# Patient Record
Sex: Female | Born: 1955 | Race: White | Hispanic: No | Marital: Married | Smoking: Former smoker
Health system: Southern US, Community
[De-identification: ages and names within clinical notes are randomized; demographics above are authoritative.]

## PROBLEM LIST (undated history)

## (undated) DIAGNOSIS — G4700 Insomnia, unspecified: Secondary | ICD-10-CM

## (undated) DIAGNOSIS — R51 Headache: Secondary | ICD-10-CM

## (undated) DIAGNOSIS — J302 Other seasonal allergic rhinitis: Secondary | ICD-10-CM

## (undated) DIAGNOSIS — D219 Benign neoplasm of connective and other soft tissue, unspecified: Secondary | ICD-10-CM

## (undated) DIAGNOSIS — R519 Headache, unspecified: Secondary | ICD-10-CM

## (undated) DIAGNOSIS — I10 Essential (primary) hypertension: Secondary | ICD-10-CM

## (undated) DIAGNOSIS — R55 Syncope and collapse: Secondary | ICD-10-CM

## (undated) DIAGNOSIS — M25519 Pain in unspecified shoulder: Secondary | ICD-10-CM

## (undated) DIAGNOSIS — N939 Abnormal uterine and vaginal bleeding, unspecified: Secondary | ICD-10-CM

## (undated) HISTORY — DX: Benign neoplasm of connective and other soft tissue, unspecified: D21.9

## (undated) HISTORY — DX: Pain in unspecified shoulder: M25.519

## (undated) HISTORY — DX: Headache: R51

## (undated) HISTORY — DX: Headache, unspecified: R51.9

## (undated) HISTORY — DX: Syncope and collapse: R55

## (undated) HISTORY — PX: VAGINAL HYSTERECTOMY: SUR661

## (undated) HISTORY — DX: Abnormal uterine and vaginal bleeding, unspecified: N93.9

## (undated) HISTORY — PX: BUNIONECTOMY: SHX129

## (undated) HISTORY — PX: TONSILLECTOMY: SUR1361

## (undated) HISTORY — DX: Other seasonal allergic rhinitis: J30.2

## (undated) HISTORY — DX: Insomnia, unspecified: G47.00

## (undated) HISTORY — PX: ADENOIDECTOMY: SUR15

## (undated) HISTORY — PX: DILATION AND CURETTAGE OF UTERUS: SHX78

## (undated) HISTORY — DX: Essential (primary) hypertension: I10

---

## 2009-06-11 ENCOUNTER — Ambulatory Visit: Payer: Self-pay | Admitting: General Surgery

## 2009-10-14 ENCOUNTER — Other Ambulatory Visit: Payer: Self-pay | Admitting: Unknown Physician Specialty

## 2010-01-09 ENCOUNTER — Emergency Department (HOSPITAL_COMMUNITY)
Admission: EM | Admit: 2010-01-09 | Discharge: 2010-01-09 | Payer: Self-pay | Source: Home / Self Care | Admitting: Emergency Medicine

## 2010-01-09 ENCOUNTER — Emergency Department (HOSPITAL_COMMUNITY)
Admission: EM | Admit: 2010-01-09 | Discharge: 2010-01-09 | Disposition: A | Discharge status: Other Acute Inpatient Hospital | Payer: Self-pay | Source: Home / Self Care | Admitting: Family Medicine

## 2010-02-25 ENCOUNTER — Ambulatory Visit (HOSPITAL_COMMUNITY)
Admission: RE | Admit: 2010-02-25 | Discharge: 2010-02-25 | Payer: Self-pay | Source: Home / Self Care | Attending: Orthopedic Surgery | Admitting: Orthopedic Surgery

## 2011-06-16 ENCOUNTER — Ambulatory Visit: Payer: Self-pay | Admitting: Obstetrics and Gynecology

## 2011-08-02 ENCOUNTER — Ambulatory Visit: Payer: Self-pay | Admitting: Obstetrics and Gynecology

## 2011-08-02 DIAGNOSIS — I10 Essential (primary) hypertension: Secondary | ICD-10-CM

## 2011-08-02 LAB — BASIC METABOLIC PANEL
Calcium, Total: 9 mg/dL (ref 8.5–10.1)
Co2: 28 mmol/L (ref 21–32)
Creatinine: 0.94 mg/dL (ref 0.60–1.30)
EGFR (Non-African Amer.): >60
Osmolality: 280 (ref 275–301)
Potassium: 3.9 mmol/L (ref 3.5–5.1)
Sodium: 139 mmol/L (ref 136–145)

## 2011-08-02 LAB — CBC
HCT: 38.9 % (ref 35.0–47.0)
RBC: 4.11 10*6/uL (ref 3.80–5.20)

## 2011-08-08 ENCOUNTER — Ambulatory Visit: Payer: Self-pay | Admitting: Obstetrics and Gynecology

## 2011-08-11 LAB — PATHOLOGY REPORT

## 2013-06-20 IMAGING — US TRANSABDOMINAL ULTRASOUND OF PELVIS
1 series · 17 of 25 positions shown · non-contrast
Comparison: none

REASON FOR EXAM: Abn Uterine Bleeding Pelvic Pain
COMMENTS:

[Series 1: transabdominal ultrasound of pelvis · 17 of 47 slices shown]
[im 1/47]
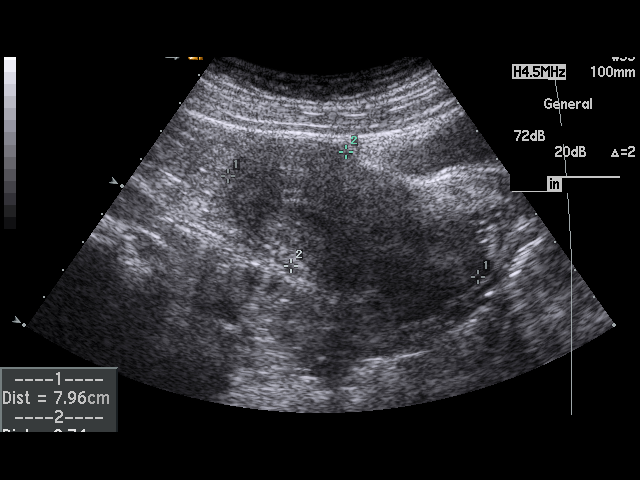
[im 4/47]
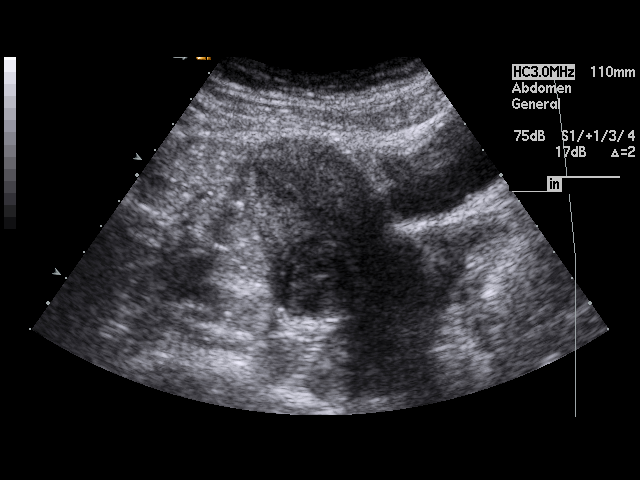
[im 6/47]
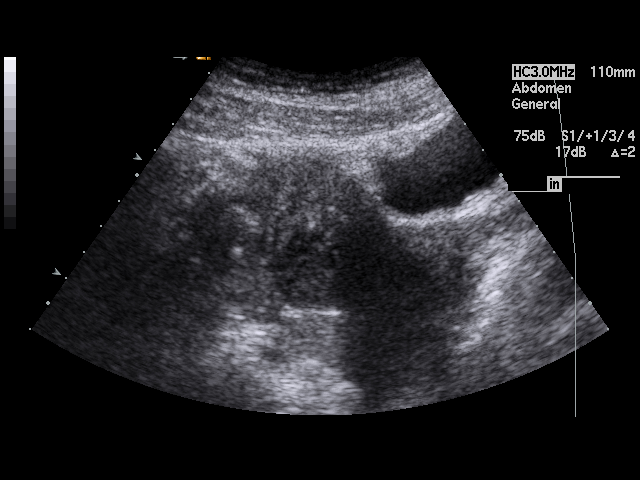
[im 10/47]
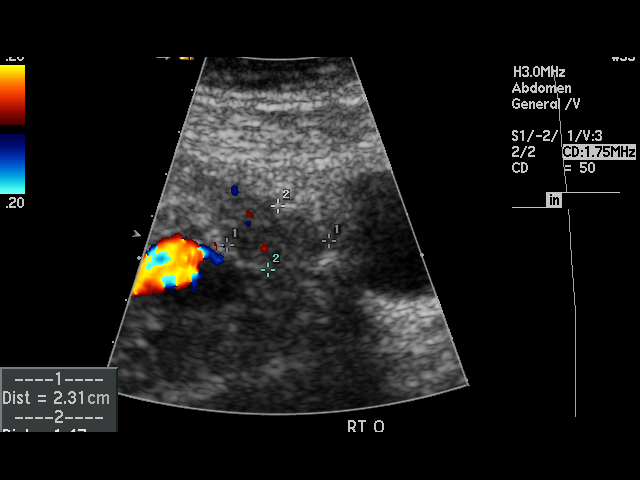
[im 12/47]
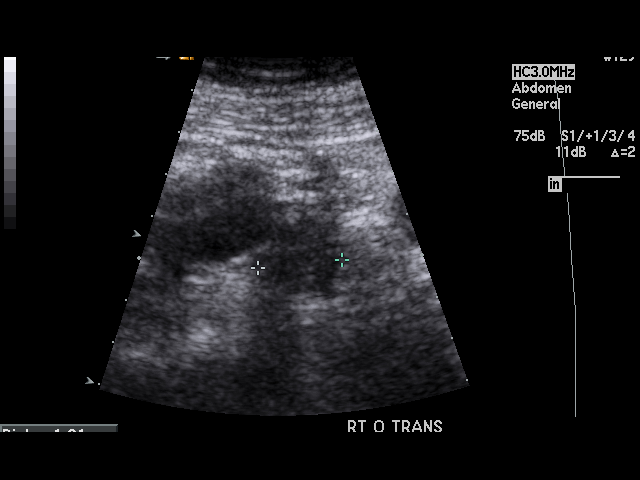
[im 16/47]
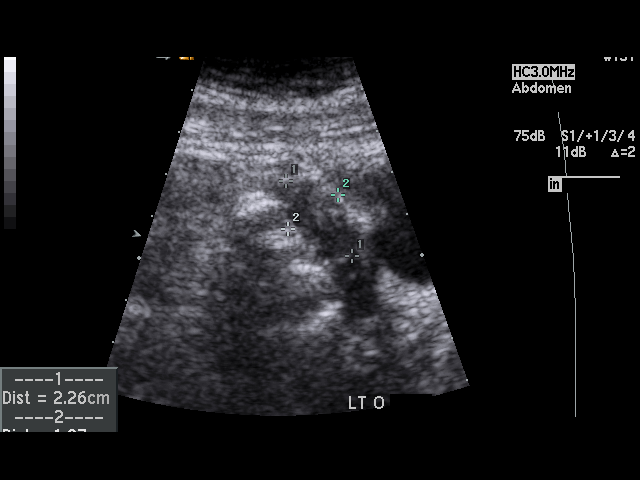
[im 18/47]
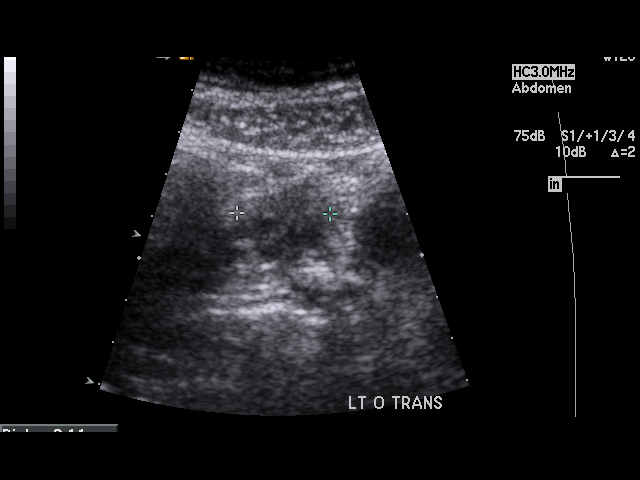
[im 22/47]
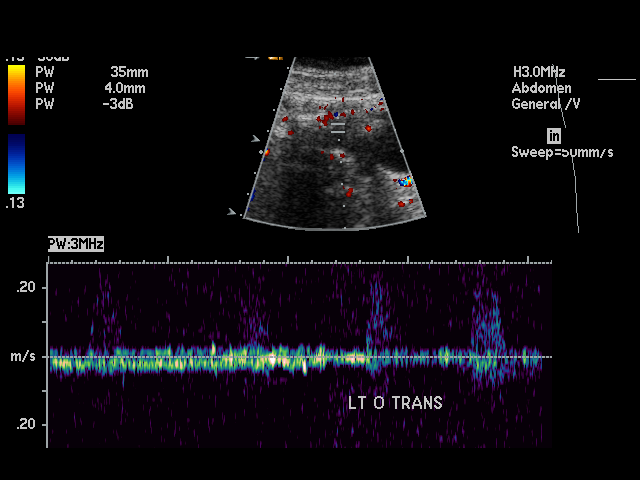
[im 24/47]
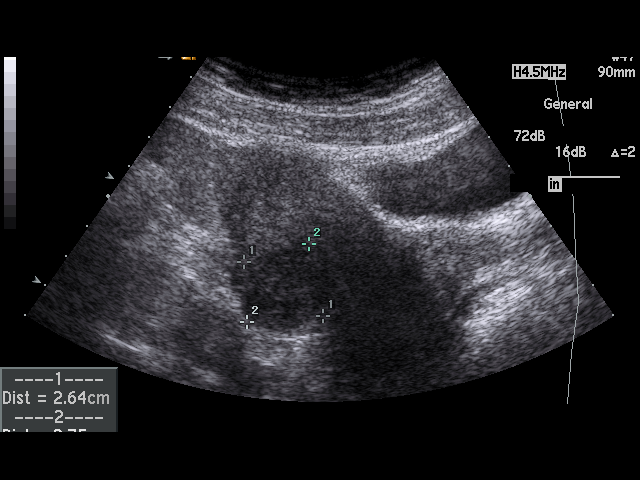
[im 25/47]
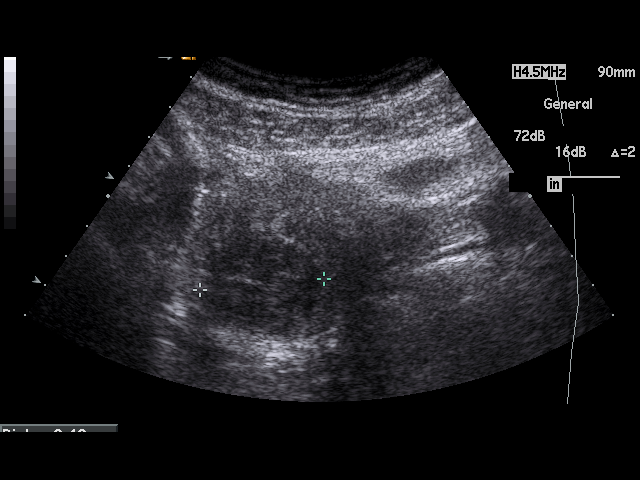
[im 29/47]
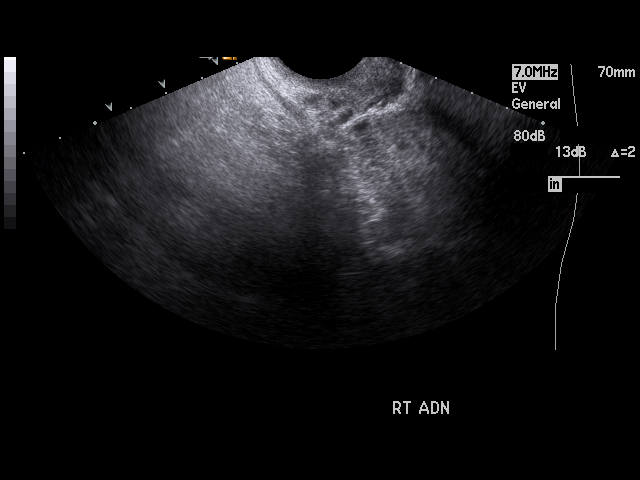
[im 31/47]
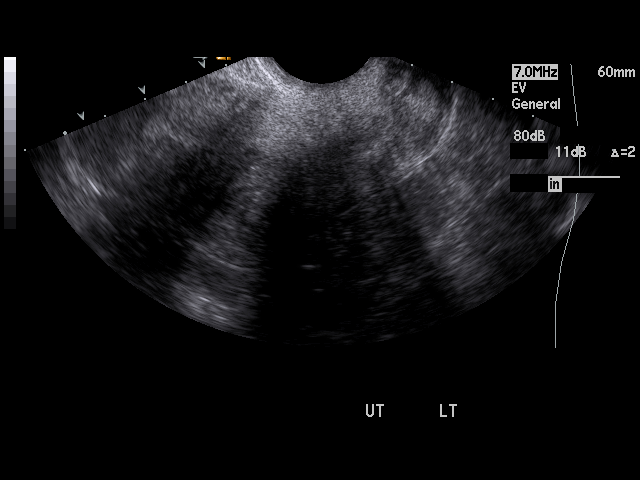
[im 35/47]
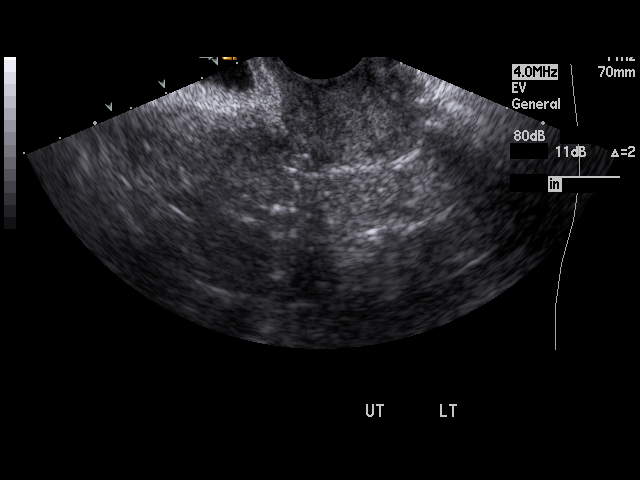
[im 37/47]
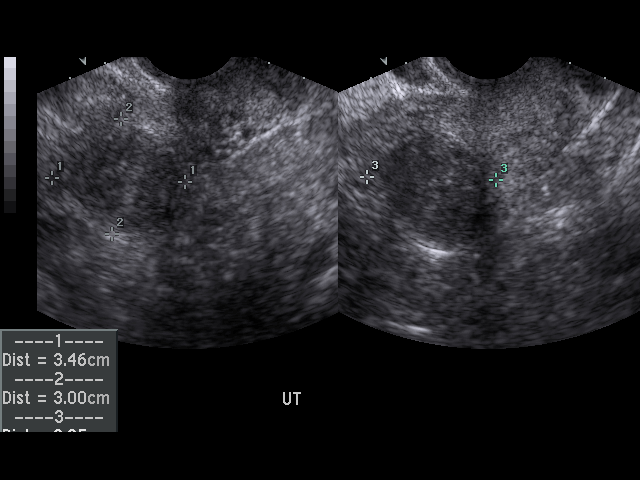
[im 41/47]
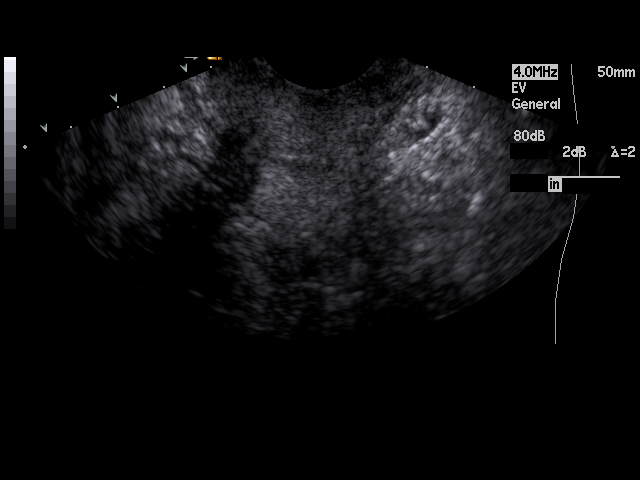
[im 43/47]
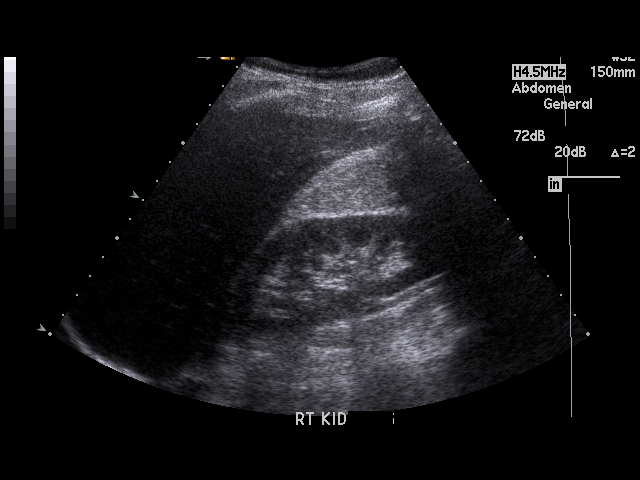
[im 47/47]
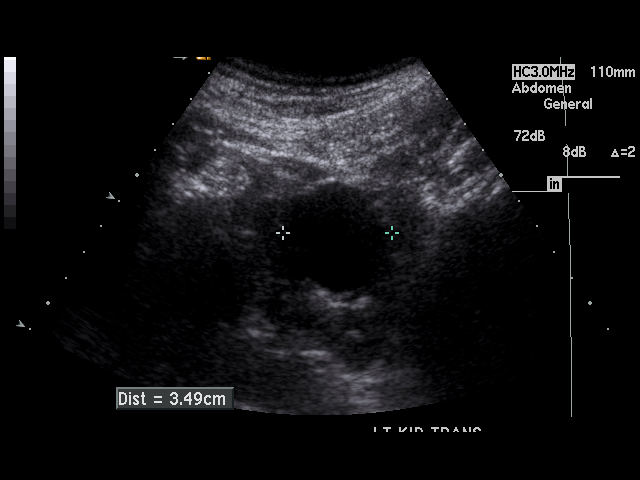

[17 of 25 positions shown; findings below may reference images not displayed]

PROCEDURE:     GE - GE PELVIS NON-OB W/TRANSVAGINAL  - June 16, 2011 [DATE]

RESULT:     Transabdominal and endovaginal ultrasound was performed. The
uterus measures 7.96 cm x 3.74 cm x 4.2 cm. The endometrium measures 3.7 mm
in thickness. There is a 3.46 cm hypoechoic mass involving the posterior
aspect of the uterine corpus and consistent with a uterine fibroid. Also
noted is a 1.1 cm hypoechoic mass near the uterine fundus on the left and
consistent with a small uterine fibroid. No other uterine masses are seen.
The right and left ovaries are visualized. The right ovary measures 2.3 cm
at maximum diameter and the left ovary measures 2.2 cm at maximum diameter.
No abnormal adnexal masses are identified. The right and left kidneys are
visualized. No hydronephrosis is seen. There is a 4.07 cm cyst at the upper
pole of the left kidney.
IMPRESSION: 1. There are observed two uterine masses consistent with uterine fibroids.
2. No abnormal adnexal masses are identified.
3. No free fluid is seen in the pelvis.
4. Incidental note is made of a left renal cyst.

## 2013-10-03 LAB — HM PAP SMEAR

## 2013-10-18 DIAGNOSIS — J309 Allergic rhinitis, unspecified: Secondary | ICD-10-CM | POA: Insufficient documentation

## 2013-10-18 DIAGNOSIS — N951 Menopausal and female climacteric states: Secondary | ICD-10-CM | POA: Insufficient documentation

## 2013-10-18 DIAGNOSIS — I10 Essential (primary) hypertension: Secondary | ICD-10-CM | POA: Insufficient documentation

## 2013-10-18 DIAGNOSIS — G4700 Insomnia, unspecified: Secondary | ICD-10-CM | POA: Insufficient documentation

## 2014-06-08 NOTE — Op Note (Signed)
PATIENT NAME:  Jill Holland, Jill Holland MR#:  [id number] DATE OF BIRTH:  1955/06/16  DATE OF PROCEDURE:  08/08/2011  PREOPERATIVE DIAGNOSIS:  1. Abnormal uterine bleeding.  2. Chronic pelvic pain.  3. Leiomyoma uteri.   POSTOPERATIVE DIAGNOSES:  1. Abnormal uterine bleeding.  2. Chronic pelvic pain.  3. Leiomyoma uteri.   OPERATIVE PROCEDURES: Transvaginal hysterectomy with bilateral salpingo-oophorectomy.   SURGEON: Jill Holland. Jill Fiorentino, MD  FIRST ASSISTANT: None.   ANESTHESIA: General endotracheal.   INDICATIONS: Jill Holland is a 59 year old white female, para 2-0-0-2, who presents for surgical management of chronic pelvic pain and abnormal uterine bleeding which has been refractory to conservative medical therapies. She does have known leiomyoma on ultrasound. The patient desires definitive surgery, including removal of both ovaries.   FINDINGS AT SURGERY: Findings at surgery revealed a multifibroid uterus. The ovaries and tubes were grossly normal bilaterally.   DESCRIPTION OF PROCEDURE: The patient was brought to the Operating Room where she was placed in the supine position. General endotracheal anesthesia was induced without difficulty. She was placed in the dorsal lithotomy position using the candy cane stirrups. A Betadine abdominal, perineal, intravaginal prep and drape was performed in the standard fashion. A Foley catheter was placed and was draining clear yellow urine from the bladder. A weighted speculum was placed into the vagina, and a double-tooth tenaculum was placed on the anterior lip of the cervix. Posterior colpotomy was made with Mayo scissors. The uterosacral ligaments were clamped, cut, and stick tied using 0 Vicryl suture. Two other additional bites were taken along the uterosacral ligament complexes and were stick tied with 0 Vicryl. The cervix was circumscribed with a scalpel. The bladder was dissected off the lower uterine segment through sharp and blunt dissection.  The anterior cul-de-sac was eventually entered. Sequentially, the cardinal-broad ligament complexes were clamped, cut, and stick tied up to the level of the utero-ovarian ligaments. At this point, the ligaments were crossclamped and the uterus was removed from the operative field. The ovaries were identified bilaterally using the aid of a Babcock clamp. The infundibulopelvic ligaments were then crossclamped with a curved Heaney clamp. The tube and ovary were excised with Mayo scissors. This was Holland bilaterally. Next, the pedicles were made hemostatic with 0 Vicryl suture. The first tie was a free tie. The following tie was a stick tie. These pedicles were tagged. Next, the posterior cuff was run using a 0 chromic suture in simple baseball stitch running manner. A good hemostasis was obtained here. Finally, a pursestring suture of 0 Vicryl was used to close the peritoneum. It was free tied using the Vicryl suture. Finally, the vaginal mucosa was closed using 2-0 chromic sutures in a simple interrupted manner. Upon completion of the procedure, all instruments were removed from the vagina. The patient was then awakened, extubated, and taken to the recovery room in satisfactory condition. Estimated blood loss was 50 mL. IV fluids infused were 1800 mL. Urine output was 300 mL of clear urine. All instruments, needle and sponge counts were verified as correct. The patient did receive Ancef antibiotic prophylaxis.   ____________________________ Jill Holland Jill Brien, MD mad:cbb D: 08/08/2011 23:14:31 ET T: 08/09/2011 10:20:25 ET JOB#: [id number]  cc: Jill Holland A. Jill Sanger, MD, <Dictator> Jill Holland Jill Bold MD ELECTRONICALLY SIGNED 08/16/2011 12:14

## 2014-06-08 NOTE — H&P (Signed)
PATIENT NAME:  Jill Holland, SKODA MR#:  [id number] DATE OF BIRTH:  Apr 13, 1955  DATE OF ADMISSION:  08/08/2011  PREOPERATIVE DIAGNOSES:  1. Chronic pelvic pain.  2. Abnormal uterine bleeding refractory to conservative medical therapy.  3. Uterine fibroids.   HISTORY: Jill Holland is a 59 year old white female, para 1-0-1-1, on continuous HRT therapy with Prempro 0.45/1.5 mg p.o. daily who presents for definitive surgery of multiple medical issues. The patient has been having chronic pelvic pain and abnormal uterine bleeding that has been refractory to hormonal therapy; endometrial biopsy 06/15/2011 revealed benign endometrial fragments without hyperplasia or carcinoma. Ultrasound 06/16/2011 revealed two uterine fibroids measuring 3.46 and 1.1 cm in greatest diameter along with normal ovaries. The patient desires definitive surgery at this time.   PAST MEDICAL HISTORY:  1. Insomnia.  2. Seasonal allergies.  3. Headaches.  4. Chronic hypertension.   PAST SURGICAL HISTORY:  1. Foot surgery x4 including bunion surgery on each foot as well as reconstruction.  2. Dilation and curettage.  3. Tonsillectomy and adenoidectomy.  PAST OB HISTORY: Para 1-0-1-1. G1 SVD 7 pound 4 ounce female. G2 SAB requiring dilation and curettage.  PAST GYN HISTORY: Menarche age 29. Menopause age 47. No history of abnormal Pap smears. No history of pelvic infections or STIs.   FAMILY HISTORY: Negative for cancer of the colon or ovary. Paternal aunt had breast cancer. Heart disease and diabetes mellitus are noted in her family.   SOCIAL HISTORY: The patient does not smoke. The patient does not drink. The patient does not use drugs. 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  REVIEW OF SYSTEMS: The patient denies recent illness. She denies coagulopathy. She denies reactive airway disease.   CURRENT MEDICATIONS:  1. Nasonex spray two sprays both nostrils once a day. 2. Singulair 1 tablet daily.   3. Prempro 0.45/1.5 mg one per day. 4. Ambien 10 mg at bedtime p.r.n.  5. Lisinopril/hydrochlorothiazide 20/25 mg 1 daily.  6. Xyzal 5 mg 1 daily.   DRUG ALLERGIES: None.   PHYSICAL EXAMINATION:   VITAL SIGNS: Height 5 feet 6 inches, weight 137, blood pressure 120/79, BMI 22, pulse 77.   GENERAL: The patient is a pleasant well appearing white female with normal affect. She is alert and oriented.   OROPHARYNX: Clear.   NECK: Supple. No thyromegaly or adenopathy.   LUNGS: Clear.   HEART: Regular rate and rhythm without murmur.   ABDOMEN: Soft, nontender. No organomegaly.   PELVIC: External genitalia normal. BUS normal. Vagina has good estrogen effect. Cervix is parous. Uterus is midplane, mobile and of normal size and shape. Adnexa are clear.   RECTAL: External rectal exam is normal. Bony pelvis is gynecoid.   EXTREMITIES: Without clubbing, cyanosis, or edema.   SKIN: Without rash.   MUSCULOSKELETAL: Normal.   IMPRESSION:  1. Chronic pelvic pain.  2. Abnormal uterine bleeding refractory to hormonal therapy.  3. Uterine fibroids.   PLAN: TVH, BSO. Date of surgery is 08/08/2011.   CONSENT NOTE: Jill Holland is to undergo TVH, BSO for symptomatic fibroid. She is adamant about having ovaries removed regardless of approach. The patient is understanding of the planned procedure to be TVH, BSO with possible need for laparoscopy. She is accepting of all risks which include, but are not limited to, bleeding, infection, pelvic organ injury with need for repair, blood clot disorders, neurologic injury, anesthesia risks, and death. All questions are answered. Informed consent is given. The patient is ready and willing to proceed  with surgery as scheduled.  ____________________________ Alanda Slim Jill Ambrocio, MD mad:drc D: 08/03/2011 08:50:41 ET T: 08/03/2011 09:46:40 ET JOB#: [id number]  cc: Hassell Done A. Edynn Gillock, MD, <Dictator> Alanda Slim Seydina Holliman MD ELECTRONICALLY SIGNED  08/16/2011 12:14

## 2014-10-06 ENCOUNTER — Other Ambulatory Visit: Payer: Self-pay

## 2014-10-06 MED ORDER — ESTRADIOL 1 MG PO TABS: 1.5000 mg | ORAL_TABLET | Freq: Every day | ORAL | Status: DC

## 2014-10-09 ENCOUNTER — Encounter: Payer: Self-pay | Admitting: Obstetrics and Gynecology

## 2014-11-07 ENCOUNTER — Telehealth: Payer: Self-pay | Admitting: Obstetrics and Gynecology

## 2014-11-07 MED ORDER — ESTRADIOL 1 MG PO TABS: 1.5000 mg | ORAL_TABLET | Freq: Every day | ORAL | Status: DC

## 2014-11-07 NOTE — Telephone Encounter (Signed)
Needs estradiol 1 mg refilled.

## 2014-11-07 NOTE — Telephone Encounter (Signed)
Pt aware med erx. 

## 2014-11-27 ENCOUNTER — Ambulatory Visit (INDEPENDENT_AMBULATORY_CARE_PROVIDER_SITE_OTHER): Payer: 59 | Admitting: Obstetrics and Gynecology

## 2014-11-27 ENCOUNTER — Encounter: Payer: Self-pay | Admitting: Obstetrics and Gynecology

## 2014-11-27 VITALS — BP 115/70 | HR 94 | Ht 66.0 in | Wt 150.3 lb

## 2014-11-27 DIAGNOSIS — Z90710 Acquired absence of both cervix and uterus: Secondary | ICD-10-CM | POA: Diagnosis not present

## 2014-11-27 DIAGNOSIS — Z90722 Acquired absence of ovaries, bilateral: Secondary | ICD-10-CM

## 2014-11-27 DIAGNOSIS — Z01419 Encounter for gynecological examination (general) (routine) without abnormal findings: Secondary | ICD-10-CM

## 2014-11-27 DIAGNOSIS — R5383 Other fatigue: Secondary | ICD-10-CM | POA: Diagnosis not present

## 2014-11-27 DIAGNOSIS — Z1211 Encounter for screening for malignant neoplasm of colon: Secondary | ICD-10-CM

## 2014-11-27 DIAGNOSIS — N393 Stress incontinence (female) (male): Secondary | ICD-10-CM

## 2014-11-27 DIAGNOSIS — Z9079 Acquired absence of other genital organ(s): Secondary | ICD-10-CM | POA: Diagnosis not present

## 2014-11-27 MED ORDER — ESTRADIOL 1 MG PO TABS: 1.5000 mg | ORAL_TABLET | Freq: Every day | ORAL | Status: DC

## 2014-11-27 NOTE — Patient Instructions (Signed)
1.  No further Pap smears are necessary. 2.  Mammogram ordered. 3.  EpiprocolonTests ordered (Patient declines colonoscopy). 4.  Routine lab work ordered. 5.  Estradiol 1.5 mg daily, refills 6.  Return in one year for annual physical.

## 2014-11-27 NOTE — Progress Notes (Signed)
Patient ID: Humberto Seals, female   DOB: 26-May-1955, 59 y.o.   MRN: [id number] ANNUAL PREVENTATIVE CARE GYN  ENCOUNTER NOTE  Subjective:       ISLAND DOHMEN is a 59 y.o. No obstetric history on file. female here for a routine annual gynecologic exam.  Current complaints: 1.  Leaking some- tiny amount wearing panty liner     59 year old white female, para 1011, status post TVH, BSO for uterine fibroids, on estradiol 1.5 mg daily, presents for annual physical.  She is not experiencing any significant vasomotor symptoms.  Bowel function is normal.  She declines colonoscopy screening and is willing to proceed with a blood test.  She is having some mild stress incontinence for which she wears a panty liner.  Options of management were reviewed regarding SUI management. Patient is taking calcium with vitamin D.  She is eating healthy and exercising. Patient does complain of some chronic fatigue.  Gynecologic History No LMP recorded. Patient has had a hysterectomy. Contraception: status post hysterectomyTVH BSO Last Pap: no further paps needed. Results were: normal Last mammogram: did not due last year. Results were: normal  Obstetric History Para 1011  Past Medical History  Diagnosis Date  . Fibroids   . Abnormal uterine bleeding (AUB)   . Hypertension   . Headache   . Seasonal allergies   . Insomnia   . Shoulder pain   . Vasomotor instability     Past Surgical History  Procedure Laterality Date  . Vaginal hysterectomy      and bso-fibroid UT ovariaes w/stromal hyperplasia  . Dilation and curettage of uterus    . Bunionectomy    . Tonsillectomy    . Adenoidectomy      Current Outpatient Prescriptions on File Prior to Visit  Medication Sig Dispense Refill  . estradiol (ESTRACE) 1 MG tablet Take 1.5 tablets (1.5 mg total) by mouth daily. 45 tablet 0  . lisinopril-hydrochlorothiazide (PRINZIDE,ZESTORETIC) 20-25 MG per tablet Take 1 tablet by mouth daily.    Marland Kitchen zolpidem  (AMBIEN) 10 MG tablet Take 10 mg by mouth at bedtime as needed for sleep.     No current facility-administered medications on file prior to visit.    No Known Allergies  Social History   Social History  . Marital Status: Married    Spouse Name: N/A  . Number of Children: N/A  . Years of Education: N/A   Occupational History  . Not on file.   Social History Main Topics  . Smoking status: Former Research scientist (life sciences)  . Smokeless tobacco: Not on file  . Alcohol Use: 4.2 oz/week    7 Glasses of wine per week     Comment: qd  . Drug Use: No  . Sexual Activity: Yes    Birth Control/ Protection: Surgical   Other Topics Concern  . Not on file   Social History Narrative    Family History  Problem Relation Age of Onset  . Breast cancer Paternal Aunt   . Ovarian cancer Neg Hx   . Colon cancer Neg Hx   . Heart disease Father   . Diabetes Maternal Grandmother   . Diabetes Paternal Grandmother     The following portions of the patient's history were reviewed and updated as appropriate: allergies, current medications, past family history, past medical history, past social history, past surgical history and problem list.  Review of Systems ROS Review of Systems - General ROS: negative for - chills, fatigue, fever, hot flashes, night sweats,  weight gain or weight loss Psychological ROS: negative for - anxiety, decreased libido, depression, mood swings, physical abuse or sexual abuse Ophthalmic ROS: negative for - blurry vision, eye pain or loss of vision ENT ROS: negative for - headaches, hearing change, visual changes or vocal changes Allergy and Immunology ROS: negative for - hives, itchy/watery eyes or seasonal allergies Hematological and Lymphatic ROS: negative for - bleeding problems, bruising, swollen lymph nodes or weight loss Endocrine ROS: negative for - galactorrhea, hair pattern changes, hot flashes, malaise/lethargy, mood swings, palpitations, polydipsia/polyuria, skin changes,  temperature intolerance or unexpected weight changes Breast ROS: negative for - new or changing breast lumps or nipple discharge Respiratory ROS: negative for - cough or shortness of breath Cardiovascular ROS: negative for - chest pain, irregular heartbeat, palpitations or shortness of breath Gastrointestinal ROS: no abdominal pain, change in bowel habits, or black or bloody stools Genito-Urinary ROS: no dysuria, trouble voiding, or hematuria.  POSITIVE-mild leaking urine Musculoskeletal ROS: negative for - joint pain or joint stiffness Neurological ROS: negative for - bowel and bladder control changes Dermatological ROS: negative for rash and skin lesion changes   Objective:   BP 115/70 mmHg  Pulse 94  Ht 5\' 6"  (1.676 m)  Wt 150 lb 4.8 oz (68.176 kg)  BMI 24.27 kg/m2 CONSTITUTIONAL: Well-developed, well-nourished female in no acute distress.  PSYCHIATRIC: Normal mood and affect. Normal behavior. Normal judgment and thought content. [place]: Alert and oriented to person, place, and time. Normal muscle tone coordination. No cranial nerve deficit noted. HENT:  Normocephalic, atraumatic, External right and left ear normal. Oropharynx is clear and moist EYES: Conjunctivae and EOM are normal. Pupils are equal, round, and reactive to light. No scleral icterus.  NECK: Normal range of motion, supple, no masses.  Normal thyroid.  SKIN: Skin is warm and dry. No rash noted. Not diaphoretic. No erythema. No pallor. CARDIOVASCULAR: Normal heart rate noted, regular rhythm, no murmur. RESPIRATORY: Clear to auscultation bilaterally. Effort and breath sounds normal, no problems with respiration noted. BREASTS: Symmetric in size. No masses, skin changes, nipple drainage, or lymphadenopathy. ABDOMEN: Soft, normal bowel sounds, no distention noted.  No tenderness, rebound or guarding.  BLADDER: Normal PELVIC:  External Genitalia: Normal  BUS: Normal  Vagina: Normal; good vault support.  Cervix:  surgically absent  Uterus: surgically absent  Adnexa: Normal  RV: External Exam NormaI, No Rectal Masses and Normal Sphincter tone  MUSCULOSKELETAL: Normal range of motion. No tenderness.  No cyanosis, clubbing, or edema.  2+ distal pulses. LYMPHATIC: No Axillary, Supraclavicular, or Inguinal Adenopathy.    Assessment:   Annual gynecologic examination 59 y.o. Contraception: status post hysterectomy Normal BMI Problem List Items Addressed This Visit    None      Plan:  Pap: Not needed Mammogram: Ordered Stool Guaiac Testing:  epicolon orderd Labs: tsh vit d fbs a1c lipid Routine preventative health maintenance measures emphasized: Exercise/Diet/Weight control, Tobacco Warnings and Alcohol/Substance use risks Continue calcium with vitamin D. Refill estradiol 1.5 mg daily Return to [place], [place]  Brayton Mars, MD

## 2014-11-28 ENCOUNTER — Other Ambulatory Visit: Payer: 59

## 2014-11-29 LAB — EPI PROCOLON(R), SEPTIN 9

## 2014-11-29 LAB — CBC WITH DIFFERENTIAL/PLATELET
BASOS ABS: 0 10*3/uL (ref 0.0–0.2)
Basos: 0 %
EOS (ABSOLUTE): 0.1 10*3/uL (ref 0.0–0.4)
EOS: 2 %
HEMOGLOBIN: 14 g/dL (ref 11.1–15.9)
Hematocrit: 41.1 % (ref 34.0–46.6)
IMMATURE GRANULOCYTES: 0 %
Immature Grans (Abs): 0 10*3/uL (ref 0.0–0.1)
LYMPHS ABS: 2.3 10*3/uL (ref 0.7–3.1)
LYMPHS: 40 %
MCH: 32 pg (ref 26.6–33.0)
MCHC: 34.1 g/dL (ref 31.5–35.7)
MCV: 94 fL (ref 79–97)
MONOCYTES: 8 %
Monocytes Absolute: 0.5 10*3/uL (ref 0.1–0.9)
NEUTROS PCT: 50 %
Neutrophils Absolute: 2.9 10*3/uL (ref 1.4–7.0)
Platelets: 267 10*3/uL (ref 150–379)
RBC: 4.37 x10E6/uL (ref 3.77–5.28)
RDW: 12.9 % (ref 12.3–15.4)
WBC: 5.8 10*3/uL (ref 3.4–10.8)

## 2014-11-29 LAB — TSH: TSH: 1.03 u[IU]/mL (ref 0.450–4.500)

## 2014-11-29 LAB — HEMOGLOBIN A1C
Est. average glucose Bld gHb Est-mCnc: 111 mg/dL
Hgb A1c MFr Bld: 5.5 % (ref 4.8–5.6)

## 2014-11-29 LAB — LIPID PANEL
CHOL/HDL RATIO: 2.2 ratio (ref 0.0–4.4)
Cholesterol, Total: 260 mg/dL — ABNORMAL HIGH (ref 100–199)
HDL: 119 mg/dL (ref >39)
LDL CALC: 121 mg/dL — AB (ref 0–99)
TRIGLYCERIDES: 101 mg/dL (ref 0–149)
VLDL CHOLESTEROL CAL: 20 mg/dL (ref 5–40)

## 2014-11-29 LAB — GLUCOSE, RANDOM: Glucose: 95 mg/dL (ref 65–99)

## 2014-11-29 LAB — VITAMIN D 25 HYDROXY (VIT D DEFICIENCY, FRACTURES): VIT D 25 HYDROXY: 42.4 ng/mL (ref 30.0–100.0)

## 2014-12-04 ENCOUNTER — Telehealth: Payer: Self-pay | Admitting: Obstetrics and Gynecology

## 2014-12-04 DIAGNOSIS — Z1211 Encounter for screening for malignant neoplasm of colon: Secondary | ICD-10-CM

## 2014-12-04 NOTE — Telephone Encounter (Signed)
PT CALLED AND SHE HAD SOME LAB WORK DONE LAST WEEK AND SHE WANTED TO KNOW THE RESULTS OF THEM.

## 2014-12-05 ENCOUNTER — Encounter (INDEPENDENT_AMBULATORY_CARE_PROVIDER_SITE_OTHER): Payer: Self-pay

## 2014-12-17 NOTE — Telephone Encounter (Signed)
Pt aware of lab results- will repeat epi-procolon. Lab ordered.

## 2014-12-18 ENCOUNTER — Other Ambulatory Visit: Payer: Self-pay | Admitting: Obstetrics and Gynecology

## 2014-12-18 ENCOUNTER — Other Ambulatory Visit: Payer: 59

## 2015-01-01 LAB — EPI PROCOLON(R), SEPTIN 9

## 2015-11-30 NOTE — Progress Notes (Unsigned)
ANNUAL PREVENTATIVE CARE GYN  ENCOUNTER NOTE  Subjective:       Jill Holland is a 60 y.o. No obstetric history on file. female here for a routine annual gynecologic exam.  Current complaints: 1.      Gynecologic History No LMP recorded. Patient has had a hysterectomy. Contraception: status post hysterectomy Last Pap: not needed Results were: normal Last mammogram: ?. Results were: ?  Obstetric History OB History  No data available    Past Medical History:  Diagnosis Date  . Abnormal uterine bleeding (AUB)   . Fibroids   . Headache   . Hypertension   . Insomnia   . Seasonal allergies   . Shoulder pain   . Vasomotor instability     Past Surgical History:  Procedure Laterality Date  . ADENOIDECTOMY    . BUNIONECTOMY    . DILATION AND CURETTAGE OF UTERUS    . TONSILLECTOMY    . VAGINAL HYSTERECTOMY     and bso-fibroid UT ovariaes w/stromal hyperplasia    Current Outpatient Prescriptions on File Prior to Visit  Medication Sig Dispense Refill  . estradiol (ESTRACE) 1 MG tablet Take 1.5 tablets (1.5 mg total) by mouth daily. 45 tablet 11  . fexofenadine (ALLEGRA) 180 MG tablet Take by mouth.    Marland Kitchen lisinopril-hydrochlorothiazide (PRINZIDE,ZESTORETIC) 20-25 MG per tablet Take 1 tablet by mouth daily.    Marland Kitchen triamcinolone (NASACORT) 55 MCG/ACT AERO nasal inhaler Place into the nose.    . zolpidem (AMBIEN) 10 MG tablet Take 10 mg by mouth at bedtime as needed for sleep.     No current facility-administered medications on file prior to visit.     No Known Allergies  Social History   Social History  . Marital status: Married    Spouse name: N/A  . Number of children: N/A  . Years of education: N/A   Occupational History  . Not on file.   Social History Main Topics  . Smoking status: Former Research scientist (life sciences)  . Smokeless tobacco: Not on file  . Alcohol use 4.2 oz/week    7 Glasses of wine per week     Comment: qd  . Drug use: No  . Sexual activity: Yes    Birth  control/ protection: Surgical   Other Topics Concern  . Not on file   Social History Narrative  . No narrative on file    Family History  Problem Relation Age of Onset  . Breast cancer Paternal Aunt   . Ovarian cancer Neg Hx   . Colon cancer Neg Hx   . Heart disease Father   . Diabetes Maternal Grandmother   . Diabetes Paternal Grandmother     The following portions of the patient's history were reviewed and updated as appropriate: allergies, current medications, past family history, past medical history, past social history, past surgical history and problem list.  Review of Systems ROS Review of Systems - General ROS: negative for - chills, fatigue, fever, hot flashes, night sweats, weight gain or weight loss Psychological ROS: negative for - anxiety, decreased libido, depression, mood swings, physical abuse or sexual abuse Ophthalmic ROS: negative for - blurry vision, eye pain or loss of vision ENT ROS: negative for - headaches, hearing change, visual changes or vocal changes Allergy and Immunology ROS: negative for - hives, itchy/watery eyes or seasonal allergies Hematological and Lymphatic ROS: negative for - bleeding problems, bruising, swollen lymph nodes or weight loss Endocrine ROS: negative for - galactorrhea, hair pattern changes, hot  flashes, malaise/lethargy, mood swings, palpitations, polydipsia/polyuria, skin changes, temperature intolerance or unexpected weight changes Breast ROS: negative for - new or changing breast lumps or nipple discharge Respiratory ROS: negative for - cough or shortness of breath Cardiovascular ROS: negative for - chest pain, irregular heartbeat, palpitations or shortness of breath Gastrointestinal ROS: no abdominal pain, change in bowel habits, or black or bloody stools Genito-Urinary ROS: no dysuria, trouble voiding, or hematuria Musculoskeletal ROS: negative for - joint pain or joint stiffness Neurological ROS: negative for - bowel and  bladder control changes Dermatological ROS: negative for rash and skin lesion changes   Objective:   There were no vitals taken for this visit. CONSTITUTIONAL: Well-developed, well-nourished female in no acute distress.  PSYCHIATRIC: Normal mood and affect. Normal behavior. Normal judgment and thought content. [place]: Alert and oriented to person, place, and time. Normal muscle tone coordination. No cranial nerve deficit noted. HENT:  Normocephalic, atraumatic, External right and left ear normal. Oropharynx is clear and moist EYES: Conjunctivae and EOM are normal. Pupils are equal, round, and reactive to light. No scleral icterus.  NECK: Normal range of motion, supple, no masses.  Normal thyroid.  SKIN: Skin is warm and dry. No rash noted. Not diaphoretic. No erythema. No pallor. CARDIOVASCULAR: Normal heart rate noted, regular rhythm, no murmur. RESPIRATORY: Clear to auscultation bilaterally. Effort and breath sounds normal, no problems with respiration noted. BREASTS: Symmetric in size. No masses, skin changes, nipple drainage, or lymphadenopathy. ABDOMEN: Soft, normal bowel sounds, no distention noted.  No tenderness, rebound or guarding.  BLADDER: Normal PELVIC:  External Genitalia: Normal  BUS: Normal  Vagina: Normal  Cervix: Normal  Uterus: Normal  Adnexa: Normal  RV: {Blank multiple:19196::"External Exam NormaI","No Rectal Masses","Normal Sphincter tone"}  MUSCULOSKELETAL: Normal range of motion. No tenderness.  No cyanosis, clubbing, or edema.  2+ distal pulses. LYMPHATIC: No Axillary, Supraclavicular, or Inguinal Adenopathy.    Assessment:   Annual gynecologic examination 60 y.o. Contraception: status post hysterectomy Normal BMI Problem List Items Addressed This Visit    Status post TVH, BSO   SUI (stress urinary incontinence, female)    Other Visit Diagnoses    Well woman exam with routine gynecological exam    -  Primary   Screening for colon cancer           Plan:  Pap: Not needed Mammogram: Ordered Stool Guaiac Testing:  Ordered Labs: lipid, vit d, a1c, tsh lipid Routine preventative health maintenance measures emphasized: {Blank multiple:19196::"Exercise/Diet/Weight control","Tobacco Warnings","Alcohol/Substance use risks","Stress Management","Peer Pressure Issues","Safe Sex"} *** Return to [place], [place]

## 2015-12-01 ENCOUNTER — Encounter: Payer: 59 | Admitting: Obstetrics and Gynecology

## 2015-12-28 NOTE — Progress Notes (Deleted)
Patient ID: Jill Holland, female   DOB: 25-Aug-1955, 60 y.o.   MRN: [id number] ANNUAL PREVENTATIVE CARE GYN  ENCOUNTER NOTE  Subjective:       Jill Holland is a 60 y.o. No obstetric history on file. female here for a routine annual gynecologic exam.  Current complaints:     60 year old white female, para 1011, status post TVH, BSO for uterine fibroids, on estradiol 1.5 mg daily, presents for annual physical.  She is not experiencing any significant vasomotor symptoms.  Bowel function is normal.    She is having some mild stress incontinence for which she wears a panty liner.  Options of management were reviewed regarding SUI management. Patient is taking calcium with vitamin D.  She is eating healthy and exercising.   Gynecologic History No LMP recorded. Patient has had a hysterectomy. Contraception: status post hysterectomyTVH BSO Last Pap: no further paps needed. Results were: normal Last mammogram: no mammo last year Results were:   Obstetric History Para 1011  Past Medical History:  Diagnosis Date  . Abnormal uterine bleeding (AUB)   . Fibroids   . Headache   . Hypertension   . Insomnia   . Seasonal allergies   . Shoulder pain   . Vasomotor instability     Past Surgical History:  Procedure Laterality Date  . ADENOIDECTOMY    . BUNIONECTOMY    . DILATION AND CURETTAGE OF UTERUS    . TONSILLECTOMY    . VAGINAL HYSTERECTOMY     and bso-fibroid UT ovariaes w/stromal hyperplasia    Current Outpatient Prescriptions on File Prior to Visit  Medication Sig Dispense Refill  . estradiol (ESTRACE) 1 MG tablet Take 1.5 tablets (1.5 mg total) by mouth daily. 45 tablet 11  . fexofenadine (ALLEGRA) 180 MG tablet Take by mouth.    Marland Kitchen lisinopril-hydrochlorothiazide (PRINZIDE,ZESTORETIC) 20-25 MG per tablet Take 1 tablet by mouth daily.    Marland Kitchen triamcinolone (NASACORT) 55 MCG/ACT AERO nasal inhaler Place into the nose.    . zolpidem (AMBIEN) 10 MG tablet Take 10 mg by mouth at  bedtime as needed for sleep.     No current facility-administered medications on file prior to visit.     No Known Allergies  Social History   Social History  . Marital status: Married    Spouse name: N/A  . Number of children: N/A  . Years of education: N/A   Occupational History  . Not on file.   Social History Main Topics  . Smoking status: Former Research scientist (life sciences)  . Smokeless tobacco: Not on file  . Alcohol use 4.2 oz/week    7 Glasses of wine per week     Comment: qd  . Drug use: No  . Sexual activity: Yes    Birth control/ protection: Surgical   Other Topics Concern  . Not on file   Social History Narrative  . No narrative on file    Family History  Problem Relation Age of Onset  . Breast cancer Paternal Aunt   . Ovarian cancer Neg Hx   . Colon cancer Neg Hx   . Heart disease Father   . Diabetes Maternal Grandmother   . Diabetes Paternal Grandmother     The following portions of the patient's history were reviewed and updated as appropriate: allergies, current medications, past family history, past medical history, past social history, past surgical history and problem list.  Review of Systems ROS Review of Systems - General ROS: negative for - chills, fatigue,  fever, hot flashes, night sweats, weight gain or weight loss Psychological ROS: negative for - anxiety, decreased libido, depression, mood swings, physical abuse or sexual abuse Ophthalmic ROS: negative for - blurry vision, eye pain or loss of vision ENT ROS: negative for - headaches, hearing change, visual changes or vocal changes Allergy and Immunology ROS: negative for - hives, itchy/watery eyes or seasonal allergies Hematological and Lymphatic ROS: negative for - bleeding problems, bruising, swollen lymph nodes or weight loss Endocrine ROS: negative for - galactorrhea, hair pattern changes, hot flashes, malaise/lethargy, mood swings, palpitations, polydipsia/polyuria, skin changes, temperature intolerance  or unexpected weight changes Breast ROS: negative for - new or changing breast lumps or nipple discharge Respiratory ROS: negative for - cough or shortness of breath Cardiovascular ROS: negative for - chest pain, irregular heartbeat, palpitations or shortness of breath Gastrointestinal ROS: no abdominal pain, change in bowel habits, or black or bloody stools Genito-Urinary ROS: no dysuria, trouble voiding, or hematuria.  POSITIVE-mild leaking urine Musculoskeletal ROS: negative for - joint pain or joint stiffness Neurological ROS: negative for - bowel and bladder control changes Dermatological ROS: negative for rash and skin lesion changes   Objective:   There were no vitals taken for this visit. CONSTITUTIONAL: Well-developed, well-nourished female in no acute distress.  PSYCHIATRIC: Normal mood and affect. Normal behavior. Normal judgment and thought content. [place]: Alert and oriented to person, place, and time. Normal muscle tone coordination. No cranial nerve deficit noted. HENT:  Normocephalic, atraumatic, External right and left ear normal. Oropharynx is clear and moist EYES: Conjunctivae and EOM are normal. Pupils are equal, round, and reactive to light. No scleral icterus.  NECK: Normal range of motion, supple, no masses.  Normal thyroid.  SKIN: Skin is warm and dry. No rash noted. Not diaphoretic. No erythema. No pallor. CARDIOVASCULAR: Normal heart rate noted, regular rhythm, no murmur. RESPIRATORY: Clear to auscultation bilaterally. Effort and breath sounds normal, no problems with respiration noted. BREASTS: Symmetric in size. No masses, skin changes, nipple drainage, or lymphadenopathy. ABDOMEN: Soft, normal bowel sounds, no distention noted.  No tenderness, rebound or guarding.  BLADDER: Normal PELVIC:  External Genitalia: Normal  BUS: Normal  Vagina: Normal; good vault support.  Cervix: surgically absent  Uterus: surgically absent  Adnexa: Normal  RV: External Exam  NormaI, No Rectal Masses and Normal Sphincter tone  MUSCULOSKELETAL: Normal range of motion. No tenderness.  No cyanosis, clubbing, or edema.  2+ distal pulses. LYMPHATIC: No Axillary, Supraclavicular, or Inguinal Adenopathy.    Assessment:   Annual gynecologic examination 60 y.o. Contraception: status post hysterectomy Normal BMI Problem List Items Addressed This Visit    Status post TVH, BSO   SUI (stress urinary incontinence, female)    Other Visit Diagnoses    Well woman exam with routine gynecological exam    -  Primary   Screening for colon cancer          Plan:  Pap: Not needed Mammogram: Ordered Stool Guaiac Testing:  epicolon 2016- neg repeat 2019 Labs: tsh vit d fbs a1c lipid Routine preventative health maintenance measures emphasized: Exercise/Diet/Weight control, Tobacco Warnings and Alcohol/Substance use risks Continue calcium with vitamin D. Refill estradiol 1.5 mg daily Return to [place], [place]

## 2015-12-30 ENCOUNTER — Encounter: Payer: 59 | Admitting: Obstetrics and Gynecology

## 2016-02-01 ENCOUNTER — Telehealth: Payer: Self-pay | Admitting: Obstetrics and Gynecology

## 2016-02-01 MED ORDER — ESTRADIOL 1 MG PO TABS: 1.5000 mg | ORAL_TABLET | Freq: Every day | ORAL | 0 refills | Status: DC

## 2016-02-01 NOTE — Telephone Encounter (Signed)
LMTRC

## 2016-02-01 NOTE — Telephone Encounter (Signed)
PT CALLED AND SHE NEEDS A REFILL FOR HER ESTRIDIAL, SHE HAS BEEN OUT OF IT FOR A WHILE, SHE WAS TAKING OLD HORMONES, SHE MISSED HER APPT BACK IN November FOR HER AE BUT SHE DID  RESCHEDULED IT FOR JAN 17, SHE IS GOING OUT OF TOWN TOMORROW AND WOULD LIKE TO GET IT TODAY IF SHE CAN, THE OLD HORMONES THAT SHE IS TAKING IS NOT AS GOOD, PT STATED THAT SHE TOLD HER PHARMACY TO SEND REFILL IN AND THEY TOLD HER THEY DID BUT THEY HAVE NOTHING, SHE USES [facility]. PT WOULD LIKE A CALL BACK TO LET HER KNOW WHEN RX WAS SENT

## 2016-02-01 NOTE — Telephone Encounter (Signed)
Pt aware refill denied first time d/t no ae scheduled. Appt made. Med erx.

## 2016-02-26 NOTE — Progress Notes (Deleted)
Patient ID: Jill Holland, female   DOB: 08-04-1955, 61 y.o.   MRN: [id number] ANNUAL PREVENTATIVE CARE GYN  ENCOUNTER NOTE  Subjective:       Jill Holland is a 61 y.o. No obstetric history on file. female here for a routine annual gynecologic exam.  Current complaints: 78.       61 year old white female, para 1011, status post TVH, BSO for uterine fibroids, on estradiol 1.5 mg daily, presents for annual physical.  She is not experiencing any significant vasomotor symptoms.  Bowel function is normal.  She declines colonoscopy screening and is willing to proceed with a blood test.  She is having some mild stress incontinence for which she wears a panty liner.  Options of management were reviewed regarding SUI management. Patient is taking calcium with vitamin D.  She is eating healthy and exercising. Patient does complain of some chronic fatigue.  Gynecologic History No LMP recorded. Patient has had a hysterectomy. Contraception: status post hysterectomyTVH BSO Last Pap: no further paps needed. Results were: normal Last mammogram: 05/2009 Birad 2. Results were: normal  Obstetric History Para 1011  Past Medical History:  Diagnosis Date  . Abnormal uterine bleeding (AUB)   . Fibroids   . Headache   . Hypertension   . Insomnia   . Seasonal allergies   . Shoulder pain   . Vasomotor instability     Past Surgical History:  Procedure Laterality Date  . ADENOIDECTOMY    . BUNIONECTOMY    . DILATION AND CURETTAGE OF UTERUS    . TONSILLECTOMY    . VAGINAL HYSTERECTOMY     and bso-fibroid UT ovariaes w/stromal hyperplasia    Current Outpatient Prescriptions on File Prior to Visit  Medication Sig Dispense Refill  . estradiol (ESTRACE) 1 MG tablet Take 1.5 tablets (1.5 mg total) by mouth daily. 45 tablet 0  . fexofenadine (ALLEGRA) 180 MG tablet Take by mouth.    Marland Kitchen lisinopril-hydrochlorothiazide (PRINZIDE,ZESTORETIC) 20-25 MG per tablet Take 1 tablet by mouth daily.    Marland Kitchen  triamcinolone (NASACORT) 55 MCG/ACT AERO nasal inhaler Place into the nose.    . zolpidem (AMBIEN) 10 MG tablet Take 10 mg by mouth at bedtime as needed for sleep.     No current facility-administered medications on file prior to visit.     No Known Allergies  Social History   Social History  . Marital status: Married    Spouse name: N/A  . Number of children: N/A  . Years of education: N/A   Occupational History  . Not on file.   Social History Main Topics  . Smoking status: Former Research scientist (life sciences)  . Smokeless tobacco: Not on file  . Alcohol use 4.2 oz/week    7 Glasses of wine per week     Comment: qd  . Drug use: No  . Sexual activity: Yes    Birth control/ protection: Surgical   Other Topics Concern  . Not on file   Social History Narrative  . No narrative on file    Family History  Problem Relation Age of Onset  . Breast cancer Paternal Aunt   . Ovarian cancer Neg Hx   . Colon cancer Neg Hx   . Heart disease Father   . Diabetes Maternal Grandmother   . Diabetes Paternal Grandmother     The following portions of the patient's history were reviewed and updated as appropriate: allergies, current medications, past family history, past medical history, past social history, past surgical  history and problem list.  Review of Systems ROS Review of Systems - General ROS: negative for - chills, fatigue, fever, hot flashes, night sweats, weight gain or weight loss Psychological ROS: negative for - anxiety, decreased libido, depression, mood swings, physical abuse or sexual abuse Ophthalmic ROS: negative for - blurry vision, eye pain or loss of vision ENT ROS: negative for - headaches, hearing change, visual changes or vocal changes Allergy and Immunology ROS: negative for - hives, itchy/watery eyes or seasonal allergies Hematological and Lymphatic ROS: negative for - bleeding problems, bruising, swollen lymph nodes or weight loss Endocrine ROS: negative for - galactorrhea,  hair pattern changes, hot flashes, malaise/lethargy, mood swings, palpitations, polydipsia/polyuria, skin changes, temperature intolerance or unexpected weight changes Breast ROS: negative for - new or changing breast lumps or nipple discharge Respiratory ROS: negative for - cough or shortness of breath Cardiovascular ROS: negative for - chest pain, irregular heartbeat, palpitations or shortness of breath Gastrointestinal ROS: no abdominal pain, change in bowel habits, or black or bloody stools Genito-Urinary ROS: no dysuria, trouble voiding, or hematuria.  POSITIVE-mild leaking urine Musculoskeletal ROS: negative for - joint pain or joint stiffness Neurological ROS: negative for - bowel and bladder control changes Dermatological ROS: negative for rash and skin lesion changes   Objective:   There were no vitals taken for this visit. CONSTITUTIONAL: Well-developed, well-nourished female in no acute distress.  PSYCHIATRIC: Normal mood and affect. Normal behavior. Normal judgment and thought content. [place]: Alert and oriented to person, place, and time. Normal muscle tone coordination. No cranial nerve deficit noted. HENT:  Normocephalic, atraumatic, External right and left ear normal. Oropharynx is clear and moist EYES: Conjunctivae and EOM are normal. Pupils are equal, round, and reactive to light. No scleral icterus.  NECK: Normal range of motion, supple, no masses.  Normal thyroid.  SKIN: Skin is warm and dry. No rash noted. Not diaphoretic. No erythema. No pallor. CARDIOVASCULAR: Normal heart rate noted, regular rhythm, no murmur. RESPIRATORY: Clear to auscultation bilaterally. Effort and breath sounds normal, no problems with respiration noted. BREASTS: Symmetric in size. No masses, skin changes, nipple drainage, or lymphadenopathy. ABDOMEN: Soft, normal bowel sounds, no distention noted.  No tenderness, rebound or guarding.  BLADDER: Normal PELVIC:  External Genitalia: Normal  BUS:  Normal  Vagina: Normal; good vault support.  Cervix: surgically absent  Uterus: surgically absent  Adnexa: Normal  RV: External Exam NormaI, No Rectal Masses and Normal Sphincter tone  MUSCULOSKELETAL: Normal range of motion. No tenderness.  No cyanosis, clubbing, or edema.  2+ distal pulses. LYMPHATIC: No Axillary, Supraclavicular, or Inguinal Adenopathy.    Assessment:   Annual gynecologic examination 61 y.o. Contraception: status post hysterectomy Normal BMI Problem List Items Addressed This Visit    Status post TVH, BSO    Other Visit Diagnoses    Well woman exam with routine gynecological exam    -  Primary   Screening for colon cancer          Plan:  Pap: Not needed Mammogram: Ordered Stool Guaiac Testing:  Epi pro colon neg 12/2014- stool cards ordered Labs: tsh vit d fbs a1c lipid Routine preventative health maintenance measures emphasized: Exercise/Diet/Weight control, Tobacco Warnings and Alcohol/Substance use risks Continue calcium with vitamin D. Refill estradiol 1.5 mg daily Return to [place], [place]

## 2016-02-29 ENCOUNTER — Telehealth: Payer: Self-pay | Admitting: Obstetrics and Gynecology

## 2016-02-29 MED ORDER — ESTRADIOL 1 MG PO TABS: 1.5000 mg | ORAL_TABLET | Freq: Every day | ORAL | 0 refills | Status: DC

## 2016-02-29 NOTE — Telephone Encounter (Signed)
We had to move Jill Holland' appt for AE  to 2/8 bc Dr Tennis Must is gone, she will need another month of ertradiol 1 mg....  1 1/2 tab a day. Until her next appt.

## 2016-02-29 NOTE — Telephone Encounter (Signed)
Pt aware 30 day supply erx.

## 2016-03-02 ENCOUNTER — Encounter: Payer: 59 | Admitting: Obstetrics and Gynecology

## 2016-03-21 NOTE — Progress Notes (Signed)
Patient ID: Jill Holland, female   DOB: 31-Aug-1955, 61 y.o.   MRN: [id number] ANNUAL PREVENTATIVE CARE GYN  ENCOUNTER NOTE  Subjective:       Jill Holland is a 61 y.o. G2 P59  female here for a routine annual gynecologic exam.  Current complaints: 1.   none    61 year old white female, para 1011, status post TVH, BSO for uterine fibroids, on estradiol 1.5 mg daily, presents for annual physical.  She is not experiencing any significant vasomotor symptoms.  Bowel function is normal.  She declines colonoscopy screening and is willing to proceed with a blood test.  She is having some mild stress incontinence for which she wears a panty liner.  Options of management were reviewed regarding SUI management. Patient is taking calcium with vitamin D.  She is eating healthy and exercising. Patient does complain of some chronic fatigue.  Gynecologic History No LMP recorded. Patient has had a hysterectomy. Contraception: status post hysterectomyTVH BSO Last Pap: no further paps needed. Results were: normal Last mammogram: 05/2009 Birad 2. Results were: normal  Obstetric History Para 1011  Past Medical History:  Diagnosis Date  . Abnormal uterine bleeding (AUB)   . Fibroids   . Headache   . Hypertension   . Insomnia   . Seasonal allergies   . Shoulder pain   . Vasomotor instability     Past Surgical History:  Procedure Laterality Date  . ADENOIDECTOMY    . BUNIONECTOMY    . DILATION AND CURETTAGE OF UTERUS    . TONSILLECTOMY    . VAGINAL HYSTERECTOMY     and bso-fibroid UT ovariaes w/stromal hyperplasia    Current Outpatient Prescriptions on File Prior to Visit  Medication Sig Dispense Refill  . estradiol (ESTRACE) 1 MG tablet Take 1.5 tablets (1.5 mg total) by mouth daily. 45 tablet 0  . fexofenadine (ALLEGRA) 180 MG tablet Take by mouth.    Marland Kitchen lisinopril-hydrochlorothiazide (PRINZIDE,ZESTORETIC) 20-25 MG per tablet Take 1 tablet by mouth daily.    Marland Kitchen triamcinolone  (NASACORT) 55 MCG/ACT AERO nasal inhaler Place into the nose.    . zolpidem (AMBIEN) 10 MG tablet Take 10 mg by mouth at bedtime as needed for sleep.     No current facility-administered medications on file prior to visit.     No Known Allergies  Social History   Social History  . Marital status: Married    Spouse name: N/A  . Number of children: N/A  . Years of education: N/A   Occupational History  . Not on file.   Social History Main Topics  . Smoking status: Former Research scientist (life sciences)  . Smokeless tobacco: Not on file  . Alcohol use 4.2 oz/week    7 Glasses of wine per week     Comment: qd  . Drug use: No  . Sexual activity: Yes    Birth control/ protection: Surgical   Other Topics Concern  . Not on file   Social History Narrative  . No narrative on file    Family History  Problem Relation Age of Onset  . Breast cancer Paternal Aunt   . Ovarian cancer Neg Hx   . Colon cancer Neg Hx   . Heart disease Father   . Diabetes Maternal Grandmother   . Diabetes Paternal Grandmother     The following portions of the patient's history were reviewed and updated as appropriate: allergies, current medications, past family history, past medical history, past social history, past surgical history and  problem list.  Review of Systems ROS Review of Systems - General ROS: negative for - chills, fatigue, fever, hot flashes, night sweats, weight gain or weight loss Psychological ROS: negative for - anxiety, decreased libido, depression, mood swings, physical abuse or sexual abuse Ophthalmic ROS: negative for - blurry vision, eye pain or loss of vision ENT ROS: negative for - headaches, hearing change, visual changes or vocal changes Allergy and Immunology ROS: negative for - hives, itchy/watery eyes or seasonal allergies Hematological and Lymphatic ROS: negative for - bleeding problems, bruising, swollen lymph nodes or weight loss Endocrine ROS: negative for - galactorrhea, hair pattern  changes, hot flashes, malaise/lethargy, mood swings, palpitations, polydipsia/polyuria, skin changes, temperature intolerance or unexpected weight changes Breast ROS: negative for - new or changing breast lumps or nipple discharge Respiratory ROS: negative for - cough or shortness of breath Cardiovascular ROS: negative for - chest pain, irregular heartbeat, palpitations or shortness of breath Gastrointestinal ROS: no abdominal pain, change in bowel habits, or black or bloody stools Genito-Urinary ROS: no dysuria, trouble voiding, or hematuria.  POSITIVE-mild leaking urine Musculoskeletal ROS: negative for - joint pain or joint stiffness Neurological ROS: negative for - bowel and bladder control changes Dermatological ROS: negative for rash and skin lesion changes   Objective:    BP (!) 143/89   Pulse 74   Ht 5\' 6"  (1.676 m)   Wt 155 lb 12.8 oz (70.7 kg)   BMI 25.15 kg/m `CONSTITUTIONAL: Well-developed, well-nourished female in no acute distress.  PSYCHIATRIC: Normal mood and affect. Normal behavior. Normal judgment and thought content. [place]: Alert and oriented to person, place, and time. Normal muscle tone coordination. No cranial nerve deficit noted. HENT:  Normocephalic, atraumatic, External right and left ear normal. Oropharynx is clear and moist EYES: Conjunctivae and EOM are normal. Pupils are equal, round, and reactive to light. No scleral icterus.  NECK: Normal range of motion, supple, no masses.  Normal thyroid.  SKIN: Skin is warm and dry. No rash noted. Not diaphoretic. No erythema. No pallor. CARDIOVASCULAR: Normal heart rate noted, regular rhythm, no murmur. RESPIRATORY: Clear to auscultation bilaterally. Effort and breath sounds normal, no problems with respiration noted. BREASTS: Symmetric in size. No masses, skin changes, nipple drainage, or lymphadenopathy. ABDOMEN: Soft, normal bowel sounds, no distention noted.  No tenderness, rebound or guarding.  BLADDER:  Normal PELVIC:  External Genitalia: Normal  BUS: Normal  Vagina: Normal; good vault support.  Cervix: surgically absent  Uterus: surgically absent  Adnexa: Normal  RV: External Exam NormaI, No Rectal Masses and Normal Sphincter tone  MUSCULOSKELETAL: Normal range of motion. No tenderness.  No cyanosis, clubbing, or edema.  2+ distal pulses. LYMPHATIC: No Axillary, Supraclavicular, or Inguinal Adenopathy.    Assessment:   Annual gynecologic examination 61 y.o. Contraception: status post hysterectomy Normal BMI Problem List Items Addressed This Visit    Status post TVH, BSO    Other Visit Diagnoses    Well woman exam with routine gynecological exam    -  Primary   Screening for colon cancer          Plan:  Pap: Not needed Mammogram: Ordered Stool Guaiac Testing:  Epi pro colon neg 12/2014; repeat testing in 2019- stool cards -declined Labs: tsh vit d fbs a1c lipid Routine preventative health maintenance measures emphasized: Exercise/Diet/Weight control, Tobacco Warnings and Alcohol/Substance use risks Continue calcium with vitamin D. Refill estradiol 1.5 mg daily Return to Clinic - [place], [place]  Halea Lieb, MD  Note: This dictation was prepared with Dragon dictation along with smaller phrase technology. Any transcriptional errors that result from this process are unintentional.

## 2016-03-24 ENCOUNTER — Ambulatory Visit (INDEPENDENT_AMBULATORY_CARE_PROVIDER_SITE_OTHER): Payer: 59 | Admitting: Obstetrics and Gynecology

## 2016-03-24 ENCOUNTER — Encounter: Payer: Self-pay | Admitting: Obstetrics and Gynecology

## 2016-03-24 VITALS — BP 143/89 | HR 74 | Ht 66.0 in | Wt 155.8 lb

## 2016-03-24 DIAGNOSIS — Z90722 Acquired absence of ovaries, bilateral: Secondary | ICD-10-CM

## 2016-03-24 DIAGNOSIS — Z01419 Encounter for gynecological examination (general) (routine) without abnormal findings: Secondary | ICD-10-CM | POA: Diagnosis not present

## 2016-03-24 DIAGNOSIS — Z1231 Encounter for screening mammogram for malignant neoplasm of breast: Secondary | ICD-10-CM | POA: Diagnosis not present

## 2016-03-24 DIAGNOSIS — Z9079 Acquired absence of other genital organ(s): Secondary | ICD-10-CM

## 2016-03-24 DIAGNOSIS — N393 Stress incontinence (female) (male): Secondary | ICD-10-CM

## 2016-03-24 DIAGNOSIS — Z1239 Encounter for other screening for malignant neoplasm of breast: Secondary | ICD-10-CM

## 2016-03-24 DIAGNOSIS — Z90710 Acquired absence of both cervix and uterus: Secondary | ICD-10-CM

## 2016-03-24 DIAGNOSIS — Z1211 Encounter for screening for malignant neoplasm of colon: Secondary | ICD-10-CM | POA: Diagnosis not present

## 2016-03-24 MED ORDER — ESTRADIOL 1 MG PO TABS: 1.5000 mg | ORAL_TABLET | Freq: Every day | ORAL | 3 refills | Status: DC

## 2016-03-24 NOTE — Patient Instructions (Signed)
1. No Pap smear needed 2. Mammogram ordered 3. Stool guaiac card testing is deferred per patient request 4. Estradiol 1.5 mg a day is refilled 5. Continue with calcium and vitamin D supplementation daily 6. Continue with healthy eating and exercise 7. Return in 1 year for annual exam   Health Maintenance for Postmenopausal Women Introduction Menopause is a normal process in which your reproductive ability comes to an end. This process happens gradually over a span of months to years, usually between the ages of 63 and 2. Menopause is complete when you have missed 12 consecutive menstrual periods. It is important to talk with your health care provider about some of the most common conditions that affect postmenopausal women, such as heart disease, cancer, and bone loss (osteoporosis). Adopting a healthy lifestyle and getting preventive care can help to promote your health and wellness. Those actions can also lower your chances of developing some of these common conditions. What should I know about menopause? During menopause, you may experience a number of symptoms, such as:  Moderate-to-severe hot flashes.  Night sweats.  Decrease in sex drive.  Mood swings.  Headaches.  Tiredness.  Irritability.  Memory problems.  Insomnia. Choosing to treat or not to treat menopausal changes is an individual decision that you make with your health care provider. What should I know about hormone replacement therapy and supplements? Hormone therapy products are effective for treating symptoms that are associated with menopause, such as hot flashes and night sweats. Hormone replacement carries certain risks, especially as you become older. If you are thinking about using estrogen or estrogen with progestin treatments, discuss the benefits and risks with your health care provider. What should I know about heart disease and stroke? Heart disease, heart attack, and stroke become more likely as you  age. This may be due, in part, to the hormonal changes that your body experiences during menopause. These can affect how your body processes dietary fats, triglycerides, and cholesterol. Heart attack and stroke are both medical emergencies. There are many things that you can do to help prevent heart disease and stroke:  Have your blood pressure checked at least every 1-2 years. High blood pressure causes heart disease and increases the risk of stroke.  If you are 22-34 years old, ask your health care provider if you should take aspirin to prevent a heart attack or a stroke.  Do not use any tobacco products, including cigarettes, chewing tobacco, or electronic cigarettes. If you need help quitting, ask your health care provider.  It is important to eat a healthy diet and maintain a healthy weight.  Be sure to include plenty of vegetables, fruits, low-fat dairy products, and lean protein.  Avoid eating foods that are high in solid fats, added sugars, or salt (sodium).  Get regular exercise. This is one of the most important things that you can do for your health.  Try to exercise for at least 150 minutes each week. The type of exercise that you do should increase your heart rate and make you sweat. This is known as moderate-intensity exercise.  Try to do strengthening exercises at least twice each week. Do these in addition to the moderate-intensity exercise.  Know your numbers.Ask your health care provider to check your cholesterol and your blood glucose. Continue to have your blood tested as directed by your health care provider. What should I know about cancer screening? There are several types of cancer. Take the following steps to reduce your risk and to  catch any cancer development as early as possible. Breast Cancer  Practice breast self-awareness.  This means understanding how your breasts normally appear and feel.  It also means doing regular breast self-exams. Let your health  care provider know about any changes, no matter how small.  If you are 37 or older, have a clinician do a breast exam (clinical breast exam or CBE) every year. Depending on your age, family history, and medical history, it may be recommended that you also have a yearly breast X-ray (mammogram).  If you have a family history of breast cancer, talk with your health care provider about genetic screening.  If you are at high risk for breast cancer, talk with your health care provider about having an MRI and a mammogram every year.  Breast cancer (BRCA) gene test is recommended for women who have family members with BRCA-related cancers. Results of the assessment will determine the need for genetic counseling and BRCA1 and for BRCA2 testing. BRCA-related cancers include these types:  Breast. This occurs in males or females.  Ovarian.  Tubal. This may also be called fallopian tube cancer.  Cancer of the abdominal or pelvic lining (peritoneal cancer).  Prostate.  Pancreatic. Cervical, Uterine, and Ovarian Cancer  Your health care provider may recommend that you be screened regularly for cancer of the pelvic organs. These include your ovaries, uterus, and vagina. This screening involves a pelvic exam, which includes checking for microscopic changes to the surface of your cervix (Pap test).  For women ages 21-65, health care providers may recommend a pelvic exam and a Pap test every three years. For women ages 68-65, they may recommend the Pap test and pelvic exam, combined with testing for human papilloma virus (HPV), every five years. Some types of HPV increase your risk of cervical cancer. Testing for HPV may also be done on women of any age who have unclear Pap test results.  Other health care providers may not recommend any screening for nonpregnant women who are considered low risk for pelvic cancer and have no symptoms. Ask your health care provider if a screening pelvic exam is right for  you.  If you have had past treatment for cervical cancer or a condition that could lead to cancer, you need Pap tests and screening for cancer for at least 20 years after your treatment. If Pap tests have been discontinued for you, your risk factors (such as having a new sexual partner) need to be reassessed to determine if you should start having screenings again. Some women have medical problems that increase the chance of getting cervical cancer. In these cases, your health care provider may recommend that you have screening and Pap tests more often.  If you have a family history of uterine cancer or ovarian cancer, talk with your health care provider about genetic screening.  If you have vaginal bleeding after reaching menopause, tell your health care provider.  There are currently no reliable tests available to screen for ovarian cancer. Lung Cancer  Lung cancer screening is recommended for adults 73-71 years old who are at high risk for lung cancer because of a history of smoking. A yearly low-dose CT scan of the lungs is recommended if you:  Currently smoke.  Have a history of at least 30 pack-years of smoking and you currently smoke or have quit within the past 15 years. A pack-year is smoking an average of one pack of cigarettes per day for one year. Yearly screening should:  Continue  until it has been 15 years since you quit.  Stop if you develop a health problem that would prevent you from having lung cancer treatment. Colorectal Cancer  This type of cancer can be detected and can often be prevented.  Routine colorectal cancer screening usually begins at age 18 and continues through age 2.  If you have risk factors for colon cancer, your health care provider may recommend that you be screened at an earlier age.  If you have a family history of colorectal cancer, talk with your health care provider about genetic screening.  Your health care provider may also recommend using  home test kits to check for hidden blood in your stool.  A small camera at the end of a tube can be used to examine your colon directly (sigmoidoscopy or colonoscopy). This is done to check for the earliest forms of colorectal cancer.  Direct examination of the colon should be repeated every 5-10 years until age 39. However, if early forms of precancerous polyps or small growths are found or if you have a family history or genetic risk for colorectal cancer, you may need to be screened more often. Skin Cancer  Check your skin from head to toe regularly.  Monitor any moles. Be sure to tell your health care provider:  About any new moles or changes in moles, especially if there is a change in a mole's shape or color.  If you have a mole that is larger than the size of a pencil eraser.  If any of your family members has a history of skin cancer, especially at a young age, talk with your health care provider about genetic screening.  Always use sunscreen. Apply sunscreen liberally and repeatedly throughout the day.  Whenever you are outside, protect yourself by wearing long sleeves, pants, a wide-brimmed hat, and sunglasses. What should I know about osteoporosis? Osteoporosis is a condition in which bone destruction happens more quickly than new bone creation. After menopause, you may be at an increased risk for osteoporosis. To help prevent osteoporosis or the bone fractures that can happen because of osteoporosis, the following is recommended:  If you are 17-38 years old, get at least 1,000 mg of calcium and at least 600 mg of vitamin D per day.  If you are older than age 37 but younger than age 47, get at least 1,200 mg of calcium and at least 600 mg of vitamin D per day.  If you are older than age 51, get at least 1,200 mg of calcium and at least 800 mg of vitamin D per day. Smoking and excessive alcohol intake increase the risk of osteoporosis. Eat foods that are rich in calcium and  vitamin D, and do weight-bearing exercises several times each week as directed by your health care provider. What should I know about how menopause affects my mental health? Depression may occur at any age, but it is more common as you become older. Common symptoms of depression include:  Low or sad mood.  Changes in sleep patterns.  Changes in appetite or eating patterns.  Feeling an overall lack of motivation or enjoyment of activities that you previously enjoyed.  Frequent crying spells. Talk with your health care provider if you think that you are experiencing depression. What should I know about immunizations? It is important that you get and maintain your immunizations. These include:  Tetanus, diphtheria, and pertussis (Tdap) booster vaccine.  Influenza every year before the flu season begins.  Pneumonia vaccine.  Shingles vaccine. Your health care provider may also recommend other immunizations. This information is not intended to replace advice given to you by your health care provider. Make sure you discuss any questions you have with your health care provider. Document Released: 03/25/2005 Document Revised: 08/21/2015 Document Reviewed: 11/04/2014  2017 [employer]

## 2016-03-24 NOTE — Addendum Note (Signed)
Addended by: Elouise Munroe on: 03/24/2016 11:23 AM   Modules accepted: Orders

## 2016-03-25 LAB — HEMOGLOBIN A1C
ESTIMATED AVERAGE GLUCOSE: 100 mg/dL
Hgb A1c MFr Bld: 5.1 % (ref 4.8–5.6)

## 2016-03-25 LAB — LIPID PANEL
Chol/HDL Ratio: 2.2 ratio units (ref 0.0–4.4)
Cholesterol, Total: 233 mg/dL — ABNORMAL HIGH (ref 100–199)
HDL: 106 mg/dL (ref >39)
LDL Calculated: 92 mg/dL (ref 0–99)
TRIGLYCERIDES: 174 mg/dL — AB (ref 0–149)
VLDL Cholesterol Cal: 35 mg/dL (ref 5–40)

## 2016-03-25 LAB — GLUCOSE, RANDOM: Glucose: 94 mg/dL (ref 65–99)

## 2016-03-25 LAB — TSH: TSH: 1.06 u[IU]/mL (ref 0.450–4.500)

## 2016-03-25 LAB — VITAMIN D 25 HYDROXY (VIT D DEFICIENCY, FRACTURES): VIT D 25 HYDROXY: 42.2 ng/mL (ref 30.0–100.0)

## 2017-03-24 NOTE — Progress Notes (Deleted)
Patient ID: Jill Holland, female   DOB: 31-Jul-1955, 62 y.o.   MRN: [id number] ANNUAL PREVENTATIVE CARE GYN  ENCOUNTER NOTE  Subjective:       Jill Holland is a 62 y.o. G2 P56  female here for a routine annual gynecologic exam.  Current complaints: 1.   none    62 year old white female, para 1011, status post TVH, BSO for uterine fibroids, on estradiol 1.5 mg daily, presents for annual physical.  She is not experiencing any significant vasomotor symptoms.  Bowel function is normal.  She declines colonoscopy screening and is willing to proceed with a blood test.  She is having some mild stress incontinence for which she wears a panty liner.  Options of management were reviewed regarding SUI management. Patient is taking calcium with vitamin D.  She is eating healthy and exercising. Patient does complain of some chronic fatigue.  Gynecologic History No LMP recorded. Patient has had a hysterectomy. Contraception: status post hysterectomyTVH BSO Last Pap: no further paps needed. Results were: normal Last mammogram: 05/2009 [facility]. Results were: normal  Obstetric History Para 1011  Past Medical History:  Diagnosis Date  . Abnormal uterine bleeding (AUB)   . Fibroids   . Headache   . Hypertension   . Insomnia   . Seasonal allergies   . Shoulder pain   . Vasomotor instability     Past Surgical History:  Procedure Laterality Date  . ADENOIDECTOMY    . BUNIONECTOMY    . DILATION AND CURETTAGE OF UTERUS    . TONSILLECTOMY    . VAGINAL HYSTERECTOMY     and bso-fibroid UT ovariaes w/stromal hyperplasia    Current Outpatient Medications on File Prior to Visit  Medication Sig Dispense Refill  . estradiol (ESTRACE) 1 MG tablet Take 1.5 tablets (1.5 mg total) by mouth daily. 135 tablet 3  . lisinopril-hydrochlorothiazide (PRINZIDE,ZESTORETIC) 20-25 MG per tablet Take 1 tablet by mouth daily.    Marland Kitchen loratadine (CLARITIN) 10 MG tablet TAKE 1 TABLET BY ORAL ROUTE EVERY DAY    .  traZODone (DESYREL) 50 MG tablet     . triamcinolone (NASACORT) 55 MCG/ACT AERO nasal inhaler Place into the nose.     No current facility-administered medications on file prior to visit.     No Known Allergies  Social History   Socioeconomic History  . Marital status: Married    Spouse name: Not on file  . Number of children: Not on file  . Years of education: Not on file  . Highest education level: Not on file  Social Needs  . Financial resource strain: Not on file  . Food insecurity - worry: Not on file  . Food insecurity - inability: Not on file  . Transportation needs - medical: Not on file  . Transportation needs - non-medical: Not on file  Occupational History  . Not on file  Tobacco Use  . Smoking status: Former Research scientist (life sciences)  . Smokeless tobacco: Never Used  Substance and Sexual Activity  . Alcohol use: Yes    Alcohol/week: 4.2 oz    Types: 7 Glasses of wine per week    Comment: qd  . Drug use: No  . Sexual activity: Yes    Birth control/protection: Surgical  Other Topics Concern  . Not on file  Social History Narrative  . Not on file    Family History  Problem Relation Age of Onset  . Breast cancer Paternal Aunt   . Heart disease Father   .  Diabetes Maternal Grandmother   . Diabetes Paternal Grandmother   . Ovarian cancer Neg Hx   . Colon cancer Neg Hx     The following portions of the patient's history were reviewed and updated as appropriate: allergies, current medications, past family history, past medical history, past social history, past surgical history and problem list.  Review of Systems ROS  Objective:    There were no vitals taken for this visit.`CONSTITUTIONAL: Well-developed, well-nourished female in no acute distress.  PSYCHIATRIC: Normal mood and affect. Normal behavior. Normal judgment and thought content. [place]: Alert and oriented to person, place, and time. Normal muscle tone coordination. No cranial nerve deficit noted. HENT:   Normocephalic, atraumatic, External right and left ear normal. Oropharynx is clear and moist EYES: Conjunctivae and EOM are normal. Pupils are equal, round, and reactive to light. No scleral icterus.  NECK: Normal range of motion, supple, no masses.  Normal thyroid.  SKIN: Skin is warm and dry. No rash noted. Not diaphoretic. No erythema. No pallor. CARDIOVASCULAR: Normal heart rate noted, regular rhythm, no murmur. RESPIRATORY: Clear to auscultation bilaterally. Effort and breath sounds normal, no problems with respiration noted. BREASTS: Symmetric in size. No masses, skin changes, nipple drainage, or lymphadenopathy. ABDOMEN: Soft, normal bowel sounds, no distention noted.  No tenderness, rebound or guarding.  BLADDER: Normal PELVIC:  External Genitalia: Normal  BUS: Normal  Vagina: Normal; good vault support.  Cervix: surgically absent  Uterus: surgically absent  Adnexa: Normal  RV: External Exam NormaI, No Rectal Masses and Normal Sphincter tone  MUSCULOSKELETAL: Normal range of motion. No tenderness.  No cyanosis, clubbing, or edema.  2+ distal pulses. LYMPHATIC: No Axillary, Supraclavicular, or Inguinal Adenopathy.    Assessment:   Annual gynecologic examination 62 y.o. Contraception: status post hysterectomy Normal BMI Problem List Items Addressed This Visit    Status post TVH, BSO   SUI (stress urinary incontinence, female)    Other Visit Diagnoses    Well woman exam with routine gynecological exam    -  Primary   Screening for colon cancer       Screening for breast cancer          Plan:  Pap: Not needed Mammogram: Ordered Stool Guaiac Testing:  Epi pro colon neg 12/2014; repeat testing in 2019- stool cards -declined Labs: tsh vit d fbs a1c lipid Routine preventative health maintenance measures emphasized: Exercise/Diet/Weight control, Tobacco Warnings and Alcohol/Substance use risks Continue calcium with vitamin D. Refill estradiol 1.5 mg daily Return to [place]   Note: This dictation was prepared with Dragon dictation along with smaller phrase technology. Any transcriptional errors that result from this process are unintentional.

## 2017-03-28 ENCOUNTER — Encounter: Payer: 59 | Admitting: Obstetrics and Gynecology

## 2017-04-25 ENCOUNTER — Telehealth: Payer: Self-pay | Admitting: Obstetrics and Gynecology

## 2017-04-25 NOTE — Telephone Encounter (Signed)
Home # vm full Cell- [id number]

## 2017-04-25 NOTE — Telephone Encounter (Signed)
Patient called stating her insurance will be changing due to her husbands retirement. She will need to move her annual out until June when the new insurance will take effect. Will she be able to get a refill on her medication until then? Thanks

## 2017-04-26 MED ORDER — ESTRADIOL 1 MG PO TABS: 1.5000 mg | ORAL_TABLET | Freq: Every day | ORAL | 0 refills | Status: DC

## 2017-04-26 NOTE — Telephone Encounter (Signed)
Pt will have insurance as of 07/2017. AE r/s to 07/17/2017. HRt erx.

## 2017-05-17 ENCOUNTER — Encounter: Payer: 59 | Admitting: Obstetrics and Gynecology

## 2017-06-26 ENCOUNTER — Emergency Department
Admission: EM | Admit: 2017-06-26 | Discharge: 2017-06-26 | Disposition: A | Discharge status: Home / Self Care | Payer: Self-pay | Attending: Emergency Medicine | Admitting: Emergency Medicine

## 2017-06-26 ENCOUNTER — Emergency Department: Payer: Self-pay

## 2017-06-26 ENCOUNTER — Encounter: Payer: Self-pay | Admitting: Emergency Medicine

## 2017-06-26 DIAGNOSIS — I10 Essential (primary) hypertension: Secondary | ICD-10-CM | POA: Insufficient documentation

## 2017-06-26 DIAGNOSIS — M5416 Radiculopathy, lumbar region: Secondary | ICD-10-CM | POA: Insufficient documentation

## 2017-06-26 DIAGNOSIS — Z87891 Personal history of nicotine dependence: Secondary | ICD-10-CM | POA: Insufficient documentation

## 2017-06-26 DIAGNOSIS — Z79899 Other long term (current) drug therapy: Secondary | ICD-10-CM | POA: Insufficient documentation

## 2017-06-26 MED ORDER — OXYCODONE-ACETAMINOPHEN 7.5-325 MG PO TABS: 1.0000 | ORAL_TABLET | Freq: Four times a day (QID) | ORAL | 0 refills | Status: AC | PRN

## 2017-06-26 MED ORDER — KETOROLAC TROMETHAMINE 60 MG/2ML IM SOLN
60.0000 mg | Freq: Once | INTRAMUSCULAR | Status: AC
  Administered 2017-06-26: 60 mg via INTRAMUSCULAR
  Filled 2017-06-26: qty 2

## 2017-06-26 MED ORDER — DEXAMETHASONE SODIUM PHOSPHATE 10 MG/ML IJ SOLN
10.0000 mg | Freq: Once | INTRAMUSCULAR | Status: AC
  Administered 2017-06-26: 10 mg via INTRAMUSCULAR
  Filled 2017-06-26: qty 1

## 2017-06-26 MED ORDER — METHYLPREDNISOLONE 4 MG PO TBPK: ORAL_TABLET | ORAL | 0 refills | Status: AC

## 2017-06-26 MED ORDER — CYCLOBENZAPRINE HCL 10 MG PO TABS: 10.0000 mg | ORAL_TABLET | Freq: Three times a day (TID) | ORAL | 0 refills | Status: AC | PRN

## 2017-06-26 NOTE — ED Provider Notes (Signed)
[facility] Emergency Department Provider Note   ____________________________________________   First MD Initiated Contact with Patient 06/26/17 1134     (approximate)  I have reviewed the triage vital signs and the nursing notes.   HISTORY  Chief Complaint Hip Pain and Leg Pain    HPI Jill Holland is a 62 y.o. female patient complain of radicular back pain to left lower extremity for 5 days.  Patient denies provocative incident.  Patient recently recovering from viral infection which she suspects was from a Noro virus.  Patient denies bladder bowel dysfunction.  Patient rates the pain as a 9/10.  Patient described pain is "sharp".  Patient stated no relief of her narcotic pain medication this morning.  Past Medical History:  Diagnosis Date  . Abnormal uterine bleeding (AUB)   . Fibroids   . Headache   . Hypertension   . Insomnia   . Seasonal allergies   . Shoulder pain   . Vasomotor instability     Patient Active Problem List   Diagnosis Date Noted  . Status post TVH, BSO 11/27/2014  . SUI (stress urinary incontinence, female) 11/27/2014  . Allergic rhinitis 10/18/2013  . Insomnia, persistent 10/18/2013  . Benign essential HTN 10/18/2013  . Menopausal symptom 10/18/2013    Past Surgical History:  Procedure Laterality Date  . ADENOIDECTOMY    . BUNIONECTOMY    . DILATION AND CURETTAGE OF UTERUS    . TONSILLECTOMY    . VAGINAL HYSTERECTOMY     and bso-fibroid UT ovariaes w/stromal hyperplasia    Prior to Admission medications   Medication Sig Start Date End Date Taking? Authorizing Provider  cyclobenzaprine (FLEXERIL) 10 MG tablet Take 1 tablet (10 mg total) by mouth 3 (three) times daily as needed. 06/26/17   Sable Feil, PA-C  estradiol (ESTRACE) 1 MG tablet Take 1.5 tablets (1.5 mg total) by mouth daily. 04/26/17   Defrancesco, Alanda Slim, MD  lisinopril-hydrochlorothiazide (PRINZIDE,ZESTORETIC) 20-25 MG per tablet Take 1 tablet  by mouth daily.    [provider]  loratadine (CLARITIN) 10 MG tablet TAKE 1 TABLET BY ORAL ROUTE EVERY DAY 02/28/15   [provider]  methylPREDNISolone (MEDROL DOSEPAK) 4 MG TBPK tablet Take Tapered dose as directed 06/26/17   Sable Feil, PA-C  oxyCODONE-acetaminophen (PERCOCET) 7.5-325 MG tablet Take 1 tablet by mouth every 6 (six) hours as needed for severe pain. 06/26/17   Sable Feil, PA-C  traZODone (DESYREL) 50 MG tablet  10/03/14   [provider]  triamcinolone (NASACORT) 55 MCG/ACT AERO nasal inhaler Place into the nose.    [provider]    Allergies Patient has no known allergies.  Family History  Problem Relation Age of Onset  . Breast cancer Paternal Aunt   . Heart disease Father   . Diabetes Maternal Grandmother   . Diabetes Paternal Grandmother   . Ovarian cancer Neg Hx   . Colon cancer Neg Hx     Social History Social History   Tobacco Use  . Smoking status: Former Research scientist (life sciences)  . Smokeless tobacco: Never Used  Substance Use Topics  . Alcohol use: Yes    Alcohol/week: 4.2 oz    Types: 7 Glasses of wine per week    Comment: qd  . Drug use: No    Review of Systems Constitutional: No fever/chills Eyes: No visual changes. ENT: No sore throat. Cardiovascular: Denies chest pain. Respiratory: Denies shortness of breath. Gastrointestinal: No abdominal pain.  No  nausea, no vomiting.  No diarrhea.  No constipation. Genitourinary: Negative for dysuria. Musculoskeletal: Negative for back pain. Skin: Negative for rash. Neurological: Negative for headaches, focal weakness or numbness. Psychiatric:Insomnia Endocrine:Hypertension   ____________________________________________   PHYSICAL EXAM:  VITAL SIGNS: ED Triage Vitals [06/26/17 1045]  Enc Vitals Group     BP      Pulse      Resp      Temp      Temp src      SpO2      Weight 150 lb (68 kg)     Height 5\' 6"  (1.676 m)     Head Circumference      Peak Flow        Pain Score 9     Pain Loc      Pain Edu?      Excl. in [place]?    Constitutional: Alert and oriented. Well appearing and in no acute distress. Cardiovascular: Normal rate, regular rhythm. Grossly normal heart sounds.  Good peripheral circulation. Respiratory: Normal respiratory effort.  No retractions. Lungs CTAB. Musculoskeletal: No obvious spinal deformity.  Decreased range of motion with flexion and left lateral movements.  No lower extremity tenderness nor edema.  Negative straight leg test.   Neurologic:  Normal speech and language. No gross focal neurologic deficits are appreciated. No gait instability. Skin:  Skin is warm, dry and intact. No rash noted. Psychiatric: Mood and affect are normal. Speech and behavior are normal.  ____________________________________________   LABS (all labs ordered are listed, but only abnormal results are displayed)  Labs Reviewed - No data to display ____________________________________________  EKG   ____________________________________________  RADIOLOGY  X-ray revealed degenerative changes of the lumbar spine.  Official radiology report(s): Dg Lumbar Spine Complete  Result Date: 06/26/2017 CLINICAL DATA:  Four days of low back pain with radicular symptoms in the left lower extremity. EXAM: LUMBAR SPINE - COMPLETE 4+ VIEW COMPARISON:  None. FINDINGS: The lumbar vertebral bodies are preserved in height. There is mild disc space narrowing at L3-4 and L4-5 with moderate narrowing at L5-S1. There is no spondylolisthesis. There is mild facet joint hypertrophy at L5-S1. The pedicles and transverse processes are intact. The observed portions of the sacrum are normal. IMPRESSION: Mild to moderate multilevel degenerative disc disease from L3-4 inferiorly. No compression fracture or spondylolisthesis. Electronically Signed   By: David  Martinique M.D.   On: 06/26/2017 12:44    ____________________________________________   PROCEDURES  Procedure(s)  performed: None  Procedures  Critical Care performed: No  ____________________________________________   INITIAL IMPRESSION / ASSESSMENT AND PLAN / ED COURSE  As part of my medical decision making, I reviewed the following data within the electronic MEDICAL RECORD NUMBER    Radicular back pain secondary to degenerative j disc disease.  Discussed x-ray findings with patient.  Patient given discharge care instruction advised take medication directed.  Patient advised follow-up PCP for continued care.   ____________________________________________   FINAL CLINICAL IMPRESSION(S) / ED DIAGNOSES  Final diagnoses:  Acute radicular low back pain     ED Discharge Orders        Ordered    oxyCODONE-acetaminophen (PERCOCET) 7.5-325 MG tablet  Every 6 hours PRN     06/26/17 1316    cyclobenzaprine (FLEXERIL) 10 MG tablet  3 times daily PRN     06/26/17 1316    methylPREDNISolone (MEDROL DOSEPAK) 4 MG TBPK tablet     06/26/17 1316       Note:  This  document was prepared using Systems analyst and may include unintentional dictation errors.    Sable Feil, PA-C 06/26/17 [place], [place], MD 06/26/17 1339

## 2017-06-26 NOTE — Discharge Instructions (Signed)
Follow discharge care instruction take medication as directed. °

## 2017-06-26 NOTE — ED Triage Notes (Signed)
Pt reports pain to her left lower back that radiates down her left leg since Thursday. Pt denies injuries reports it may be a nerve.

## 2017-07-17 ENCOUNTER — Encounter: Payer: 59 | Admitting: Obstetrics and Gynecology

## 2017-08-10 ENCOUNTER — Other Ambulatory Visit: Payer: Self-pay | Admitting: Obstetrics and Gynecology

## 2017-08-21 NOTE — Progress Notes (Deleted)
Patient ID: Jill Holland, female   DOB: 12/02/1955, 62 y.o.   MRN: [id number] ANNUAL PREVENTATIVE CARE GYN  ENCOUNTER NOTE  Subjective:       Jill Holland is a 62 y.o. G2 P28  female here for a routine annual gynecologic exam.  Current complaints: 1.   none    62 year old white female, para 1011, status post TVH, BSO for uterine fibroids, on estradiol 1.5 mg daily, presents for annual physical.  She is not experiencing any significant vasomotor symptoms.  Bowel function is normal.  She declines colonoscopy screening and is willing to proceed with a blood test.  She is having some mild stress incontinence for which she wears a panty liner.  Options of management were reviewed regarding SUI management. Patient is taking calcium with vitamin D.  She is eating healthy and exercising. Patient does complain of some chronic fatigue.  Gynecologic History No LMP recorded. Patient has had a hysterectomy. Contraception: status post hysterectomyTVH BSO Last Pap: no further paps needed. Results were: normal Last mammogram: 05/2009 [facility] 2. Results were: normal  Obstetric History Para 1011  Past Medical History:  Diagnosis Date  . Abnormal uterine bleeding (AUB)   . Fibroids   . Headache   . Hypertension   . Insomnia   . Seasonal allergies   . Shoulder pain   . Vasomotor instability     Past Surgical History:  Procedure Laterality Date  . ADENOIDECTOMY    . BUNIONECTOMY    . DILATION AND CURETTAGE OF UTERUS    . TONSILLECTOMY    . VAGINAL HYSTERECTOMY     and bso-fibroid UT ovariaes w/stromal hyperplasia    Current Outpatient Medications on File Prior to Visit  Medication Sig Dispense Refill  . cyclobenzaprine (FLEXERIL) 10 MG tablet Take 1 tablet (10 mg total) by mouth 3 (three) times daily as needed. 15 tablet 0  . estradiol (ESTRACE) 1 MG tablet TAKE 1 AND 1/2 TABLETS BY MOUTH EVERY DAY 45 tablet 0  . lisinopril-hydrochlorothiazide (PRINZIDE,ZESTORETIC) 20-25 MG per  tablet Take 1 tablet by mouth daily.    Marland Kitchen loratadine (CLARITIN) 10 MG tablet TAKE 1 TABLET BY ORAL ROUTE EVERY DAY    . methylPREDNISolone (MEDROL DOSEPAK) 4 MG TBPK tablet Take Tapered dose as directed 21 tablet 0  . oxyCODONE-acetaminophen (PERCOCET) 7.5-325 MG tablet Take 1 tablet by mouth every 6 (six) hours as needed for severe pain. 12 tablet 0  . traZODone (DESYREL) 50 MG tablet     . triamcinolone (NASACORT) 55 MCG/ACT AERO nasal inhaler Place into the nose.     No current facility-administered medications on file prior to visit.     No Known Allergies  Social History   Socioeconomic History  . Marital status: Married    Spouse name: Not on file  . Number of children: Not on file  . Years of education: Not on file  . Highest education level: Not on file  Occupational History  . Not on file  Social Needs  . Financial resource strain: Not on file  . Food insecurity:    Worry: Not on file    Inability: Not on file  . Transportation needs:    Medical: Not on file    Non-medical: Not on file  Tobacco Use  . Smoking status: Former Research scientist (life sciences)  . Smokeless tobacco: Never Used  Substance and Sexual Activity  . Alcohol use: Yes    Alcohol/week: 4.2 oz    Types: 7 Glasses of wine per  week    Comment: qd  . Drug use: No  . Sexual activity: Yes    Birth control/protection: Surgical  Lifestyle  . Physical activity:    Days per week: Not on file    Minutes per session: Not on file  . Stress: Not on file  Relationships  . Social connections:    Talks on phone: Not on file    Gets together: Not on file    Attends religious service: Not on file    Active member of club or organization: Not on file    Attends meetings of clubs or organizations: Not on file    Relationship status: Not on file  . Intimate partner violence:    Fear of current or ex partner: Not on file    Emotionally abused: Not on file    Physically abused: Not on file    Forced sexual activity: Not on file   Other Topics Concern  . Not on file  Social History Narrative  . Not on file    Family History  Problem Relation Age of Onset  . Breast cancer Paternal Aunt   . Heart disease Father   . Diabetes Maternal Grandmother   . Diabetes Paternal Grandmother   . Ovarian cancer Neg Hx   . Colon cancer Neg Hx     The following portions of the patient's history were reviewed and updated as appropriate: allergies, current medications, past family history, past medical history, past social history, past surgical history and problem list.  Review of Systems ROS   Objective:    There were no vitals taken for this visit.`CONSTITUTIONAL: Well-developed, well-nourished female in no acute distress.  PSYCHIATRIC: Normal mood and affect. Normal behavior. Normal judgment and thought content. [place]: Alert and oriented to person, place, and time. Normal muscle tone coordination. No cranial nerve deficit noted. HENT:  Normocephalic, atraumatic, External right and left ear normal. Oropharynx is clear and moist EYES: Conjunctivae and EOM are normal. Pupils are equal, round, and reactive to light. No scleral icterus.  NECK: Normal range of motion, supple, no masses.  Normal thyroid.  SKIN: Skin is warm and dry. No rash noted. Not diaphoretic. No erythema. No pallor. CARDIOVASCULAR: Normal heart rate noted, regular rhythm, no murmur. RESPIRATORY: Clear to auscultation bilaterally. Effort and breath sounds normal, no problems with respiration noted. BREASTS: Symmetric in size. No masses, skin changes, nipple drainage, or lymphadenopathy. ABDOMEN: Soft, normal bowel sounds, no distention noted.  No tenderness, rebound or guarding.  BLADDER: Normal PELVIC:  External Genitalia: Normal  BUS: Normal  Vagina: Normal; good vault support.  Cervix: surgically absent  Uterus: surgically absent  Adnexa: Normal  RV: External Exam NormaI, No Rectal Masses and Normal Sphincter tone  MUSCULOSKELETAL: Normal range  of motion. No tenderness.  No cyanosis, clubbing, or edema.  2+ distal pulses. LYMPHATIC: No Axillary, Supraclavicular, or Inguinal Adenopathy.    Assessment:   Annual gynecologic examination 62 y.o. Contraception: status post hysterectomy Normal BMI Problem List Items Addressed This Visit    Status post TVH, BSO   SUI (stress urinary incontinence, female)    Other Visit Diagnoses    Well woman exam with routine gynecological exam    -  Primary   Screening for colon cancer       Screening for breast cancer          Plan:  Pap: Not needed Mammogram: Ordered Stool Guaiac Testing:  Epi pro colon neg 12/2014; repeat testing in 2019- stool cards -  declined Labs: tsh vit d fbs a1c lipid Routine preventative health maintenance measures emphasized: Exercise/Diet/Weight control, Tobacco Warnings and Alcohol/Substance use risks Continue calcium with vitamin D. Refill estradiol 1.5 mg daily Return to [place]   Note: This dictation was prepared with Dragon dictation along with smaller phrase technology. Any transcriptional errors that result from this process are unintentional.

## 2017-08-22 ENCOUNTER — Encounter: Payer: 59 | Admitting: Obstetrics and Gynecology

## 2017-10-06 ENCOUNTER — Other Ambulatory Visit: Payer: Self-pay

## 2017-10-06 MED ORDER — ESTRADIOL 1 MG PO TABS: 1.5000 mg | ORAL_TABLET | Freq: Every day | ORAL | 0 refills | Status: AC

## 2017-10-09 NOTE — Progress Notes (Deleted)
Patient ID: Jill Holland, female   DOB: 1956-02-07, 62 y.o.   MRN: [id number] ANNUAL PREVENTATIVE CARE GYN  ENCOUNTER NOTE  Subjective:       Jill Holland is a 62 y.o. G2 P53  female here for a routine annual gynecologic exam.  Current complaints: 1.   none    62 year old white female, para 1011, status post TVH, BSO for uterine fibroids, on estradiol 1.5 mg daily, presents for annual physical.  She is not experiencing any significant vasomotor symptoms.  Bowel function is normal.  She declines colonoscopy screening and is willing to proceed with a blood test. Epi pro colon- 2016 neg.  She is having some mild stress incontinence for which she wears a panty liner.  Options of management were reviewed regarding SUI management. Patient is taking calcium with vitamin D.  She is eating healthy and exercising. Patient does complain of some chronic fatigue.  Gynecologic History No LMP recorded. Patient has had a hysterectomy. Contraception: status post hysterectomyTVH BSO Last Pap: no further paps needed. Results were: normal Last mammogram: 05/2009 [facility] 2. Results were: normal  Obstetric History Para 1011  Past Medical History:  Diagnosis Date  . Abnormal uterine bleeding (AUB)   . Fibroids   . Headache   . Hypertension   . Insomnia   . Seasonal allergies   . Shoulder pain   . Vasomotor instability     Past Surgical History:  Procedure Laterality Date  . ADENOIDECTOMY    . BUNIONECTOMY    . DILATION AND CURETTAGE OF UTERUS    . TONSILLECTOMY    . VAGINAL HYSTERECTOMY     and bso-fibroid UT ovariaes w/stromal hyperplasia    Current Outpatient Medications on File Prior to Visit  Medication Sig Dispense Refill  . cyclobenzaprine (FLEXERIL) 10 MG tablet Take 1 tablet (10 mg total) by mouth 3 (three) times daily as needed. 15 tablet 0  . estradiol (ESTRACE) 1 MG tablet Take 1.5 tablets (1.5 mg total) by mouth daily. 45 tablet 0  . lisinopril-hydrochlorothiazide  (PRINZIDE,ZESTORETIC) 20-25 MG per tablet Take 1 tablet by mouth daily.    Marland Kitchen loratadine (CLARITIN) 10 MG tablet TAKE 1 TABLET BY ORAL ROUTE EVERY DAY    . methylPREDNISolone (MEDROL DOSEPAK) 4 MG TBPK tablet Take Tapered dose as directed 21 tablet 0  . oxyCODONE-acetaminophen (PERCOCET) 7.5-325 MG tablet Take 1 tablet by mouth every 6 (six) hours as needed for severe pain. 12 tablet 0  . traZODone (DESYREL) 50 MG tablet     . triamcinolone (NASACORT) 55 MCG/ACT AERO nasal inhaler Place into the nose.     No current facility-administered medications on file prior to visit.     No Known Allergies  Social History   Socioeconomic History  . Marital status: Married    Spouse name: Not on file  . Number of children: Not on file  . Years of education: Not on file  . Highest education level: Not on file  Occupational History  . Not on file  Social Needs  . Financial resource strain: Not on file  . Food insecurity:    Worry: Not on file    Inability: Not on file  . Transportation needs:    Medical: Not on file    Non-medical: Not on file  Tobacco Use  . Smoking status: Former Research scientist (life sciences)  . Smokeless tobacco: Never Used  Substance and Sexual Activity  . Alcohol use: Yes    Alcohol/week: 7.0 standard drinks  Types: 7 Glasses of wine per week    Comment: qd  . Drug use: No  . Sexual activity: Yes    Birth control/protection: Surgical  Lifestyle  . Physical activity:    Days per week: Not on file    Minutes per session: Not on file  . Stress: Not on file  Relationships  . Social connections:    Talks on phone: Not on file    Gets together: Not on file    Attends religious service: Not on file    Active member of club or organization: Not on file    Attends meetings of clubs or organizations: Not on file    Relationship status: Not on file  . Intimate partner violence:    Fear of current or ex partner: Not on file    Emotionally abused: Not on file    Physically abused: Not  on file    Forced sexual activity: Not on file  Other Topics Concern  . Not on file  Social History Narrative  . Not on file    Family History  Problem Relation Age of Onset  . Breast cancer Paternal Aunt   . Heart disease Father   . Diabetes Maternal Grandmother   . Diabetes Paternal Grandmother   . Ovarian cancer Neg Hx   . Colon cancer Neg Hx     The following portions of the patient's history were reviewed and updated as appropriate: allergies, current medications, past family history, past medical history, past social history, past surgical history and problem list.  Review of Systems ROS   Objective:    There were no vitals taken for this visit.`CONSTITUTIONAL: Well-developed, well-nourished female in no acute distress.  PSYCHIATRIC: Normal mood and affect. Normal behavior. Normal judgment and thought content. [place]: Alert and oriented to person, place, and time. Normal muscle tone coordination. No cranial nerve deficit noted. HENT:  Normocephalic, atraumatic, External right and left ear normal. Oropharynx is clear and moist EYES: Conjunctivae and EOM are normal. Pupils are equal, round, and reactive to light. No scleral icterus.  NECK: Normal range of motion, supple, no masses.  Normal thyroid.  SKIN: Skin is warm and dry. No rash noted. Not diaphoretic. No erythema. No pallor. CARDIOVASCULAR: Normal heart rate noted, regular rhythm, no murmur. RESPIRATORY: Clear to auscultation bilaterally. Effort and breath sounds normal, no problems with respiration noted. BREASTS: Symmetric in size. No masses, skin changes, nipple drainage, or lymphadenopathy. ABDOMEN: Soft, normal bowel sounds, no distention noted.  No tenderness, rebound or guarding.  BLADDER: Normal PELVIC:  External Genitalia: Normal  BUS: Normal  Vagina: Normal; good vault support.  Cervix: surgically absent  Uterus: surgically absent  Adnexa: Normal  RV: External Exam NormaI, No Rectal Masses and  Normal Sphincter tone  MUSCULOSKELETAL: Normal range of motion. No tenderness.  No cyanosis, clubbing, or edema.  2+ distal pulses. LYMPHATIC: No Axillary, Supraclavicular, or Inguinal Adenopathy.    Assessment:   Annual gynecologic examination 62 y.o. Contraception: status post hysterectomy Normal BMI Problem List Items Addressed This Visit    Status post TVH, BSO   SUI (stress urinary incontinence, female)    Other Visit Diagnoses    Well woman exam with routine gynecological exam    -  Primary   Screening for colon cancer       Screening for breast cancer          Plan:  Pap: Not needed Mammogram: Ordered Stool Guaiac Testing:  Epi pro colon neg 12/2014;  repeat testing in 2019- stool cards -declined- epi pro colon -ordered Labs: tsh vit d fbs a1c lipid Routine preventative health maintenance measures emphasized: Exercise/Diet/Weight control, Tobacco Warnings and Alcohol/Substance use risks Continue calcium with vitamin D. Refill estradiol 1.5 mg daily Return to [place]   Note: This dictation was prepared with Dragon dictation along with smaller phrase technology. Any transcriptional errors that result from this process are unintentional.

## 2017-10-12 ENCOUNTER — Encounter: Payer: Self-pay | Admitting: Obstetrics and Gynecology
